# Patient Record
Sex: Female | Born: 1975 | Race: White | Hispanic: No | Marital: Married | Smoking: Former smoker
Health system: Southern US, Community
[De-identification: ages and names within clinical notes are randomized; demographics above are authoritative.]

## PROBLEM LIST (undated history)

## (undated) DIAGNOSIS — J45909 Unspecified asthma, uncomplicated: Secondary | ICD-10-CM

## (undated) DIAGNOSIS — N840 Polyp of corpus uteri: Secondary | ICD-10-CM

## (undated) DIAGNOSIS — K219 Gastro-esophageal reflux disease without esophagitis: Secondary | ICD-10-CM

## (undated) HISTORY — DX: Unspecified asthma, uncomplicated: J45.909

---
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
## 2002-08-26 HISTORY — PX: WISDOM TOOTH EXTRACTION: SHX21

## 2016-07-05 ENCOUNTER — Encounter: Payer: Self-pay | Admitting: Emergency Medicine

## 2016-07-05 ENCOUNTER — Ambulatory Visit (INDEPENDENT_AMBULATORY_CARE_PROVIDER_SITE_OTHER): Payer: Worker's Compensation

## 2016-07-05 ENCOUNTER — Ambulatory Visit
Admission: EM | Admit: 2016-07-05 | Discharge: 2016-07-05 | Disposition: A | Discharge status: Home / Self Care | Payer: Worker's Compensation | Attending: Family Medicine | Admitting: Family Medicine

## 2016-07-05 DIAGNOSIS — M940 Chondrocostal junction syndrome [Tietze]: Secondary | ICD-10-CM

## 2016-07-05 MED ORDER — NAPROXEN 500 MG PO TABS
500.0000 mg | ORAL_TABLET | Freq: Two times a day (BID) | ORAL | 0 refills | Status: DC
Start: 2016-07-05 — End: 2019-01-25

## 2016-07-05 NOTE — ED Provider Notes (Signed)
CSN: [id number]     Arrival date & time 07/05/16  [id number] History   First MD Initiated Contact with Patient 07/05/16 1005     Chief Complaint  Patient presents with  . right sided rib pain   (Consider location/radiation/quality/duration/timing/severity/associated sxs/prior Treatment) HPI  This a 40 year old female since with 17 day history of right lower rib pain anteriorly. 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 States that has been bothersome since then and since it has persisted and wanted to have it "checked out". His had no cough or shortness of breath denies any discomfort with breathing. Movement however is painful particularly with thoracic rotation or bending to the right. Is point tender anterior in the midclavicular line. Denies any fever or chills. He denies any back pain. Most comfortable position is with recumbency.        History reviewed. No pertinent past medical history. History reviewed. No pertinent surgical history. Family History  Problem Relation Age of Onset  . Rheum arthritis Mother   . CAD Mother   . Cancer Father    Social History  Substance Use Topics  . Smoking status: Former Research scientist (life sciences)  . Smokeless tobacco: Never Used  . Alcohol use Yes   OB History    No data available     Review of Systems  Constitutional: Positive for activity change. Negative for chills, fatigue and fever.  Musculoskeletal: Positive for myalgias.  All other systems reviewed and are negative.   Allergies  Patient has no known allergies.  Home Medications   Prior to Admission medications   Medication Sig Start Date End Date Taking? Authorizing Provider  loratadine (CLARITIN) 10 MG tablet Take 10 mg by mouth daily.   Yes Historical Provider, MD  naproxen (NAPROSYN) 500 MG tablet Take 1 tablet (500 mg total) by mouth 2 (two) times daily with a meal. 07/05/16   Lorin Picket, PA-C   Meds Ordered and Administered this Visit  Medications - No data to display  BP 103/84 (BP Location: Left Arm)   Pulse 86   Temp 97.4 F (36.3 C) (Tympanic)   Resp 16   Ht 5\' 3"  (1.6 m)   Wt 160 lb (72.6 kg)   LMP 06/21/2016 (Within Days) Comment: denies preg  SpO2 100%   BMI 28.34 kg/m  No data found.   Physical Exam  Constitutional: She is oriented to person, place, and time. She appears well-developed and well-nourished. No distress.  HENT:  Head: Normocephalic and atraumatic.  Eyes: EOM are normal. Pupils are equal, round, and reactive to light.  Neck: Normal range of motion. Neck supple.  Pulmonary/Chest: Effort normal and breath sounds normal. No respiratory distress. She has no wheezes. She has no rales. She exhibits tenderness.  Examination of the chest wall shows a tenderness sharply localized over the inferior most rib on the right in the midclavicular line. This reproduces Her symptoms. Is no crepitus or induration. Thoracic rotation is uncomfortable particularly with left thoracic rotation. Lateral flexion tends to increase the pain on the right. No other rib pain along the lateral aspect into the thoracic parathoracic area.  Musculoskeletal: Normal range of motion. She exhibits tenderness.  Neurological: She is alert and oriented to person, place, and time.  Skin: Skin is warm and dry. She is not diaphoretic.  Psychiatric: She has a normal mood and affect. Her  behavior is normal. Judgment and thought content normal.  Nursing note and vitals reviewed.   Urgent Care Course   Clinical Course     Procedures (including critical care time)  Labs Review Labs Reviewed - No data to display  Imaging Review Dg Chest 2 View  Result Date: 07/05/2016 CLINICAL DATA:  Chest pain after heavy lifting EXAM: CHEST  2 VIEW COMPARISON:  None. FINDINGS: Lungs are clear. Heart size and pulmonary vascularity are normal. No adenopathy. No pneumothorax. No bone  lesions. IMPRESSION: No edema or consolidation. Electronically Signed   By: Lowella Grip III M.D.   On: 07/05/2016 10:29     Visual Acuity Review  Right Eye Distance:   Left Eye Distance:   Bilateral Distance:    Right Eye Near:   Left Eye Near:    Bilateral Near:         MDM   1. Costochondritis, acute    New Prescriptions   NAPROXEN (NAPROSYN) 500 MG TABLET    Take 1 tablet (500 mg total) by mouth 2 (two) times daily with a meal.  Plan: 1. Test/x-ray results and diagnosis reviewed with patient 2. rx as per orders; risks, benefits, potential side effects reviewed with patient 3. Recommend supportive treatment with Symptom avoidance and rest. Use Naprosyn for pain control follow-up if not improving in 2 weeks 4. F/u prn if symptoms worsen or don't improve     Lorin Picket, PA-C 07/05/16 [address] [place], [place] 07/05/16 1103

## 2016-07-05 NOTE — ED Triage Notes (Signed)
Patient states that she might have pulled a muscle while at work last Tuesday.  Patient c/o pain on the right side of her ribcage.

## 2018-07-08 ENCOUNTER — Other Ambulatory Visit: Payer: Self-pay | Admitting: Physician Assistant

## 2018-08-18 ENCOUNTER — Encounter (HOSPITAL_BASED_OUTPATIENT_CLINIC_OR_DEPARTMENT_OTHER): Payer: Self-pay

## 2018-09-02 ENCOUNTER — Ambulatory Visit (HOSPITAL_BASED_OUTPATIENT_CLINIC_OR_DEPARTMENT_OTHER): Payer: BC Managed Care – PPO | Admitting: Hematology & Oncology

## 2018-09-02 ENCOUNTER — Encounter (HOSPITAL_BASED_OUTPATIENT_CLINIC_OR_DEPARTMENT_OTHER): Payer: Self-pay | Admitting: Hematology & Oncology

## 2018-09-02 VITALS — BP 111/76 | HR 88 | Temp 98.0°F | Resp 17 | Ht 64.0 in | Wt 169.0 lb

## 2018-09-02 DIAGNOSIS — R161 Splenomegaly, not elsewhere classified: Secondary | ICD-10-CM

## 2018-09-02 LAB — COMPREHENSIVE METABOLIC PANEL
ALT: 10 U/L (ref 0–55)
AST (SGOT): 13 U/L (ref 5–34)
Albumin/Globulin Ratio: 1.4 (ref 0.9–2.2)
Albumin: 3.8 g/dL (ref 3.5–5.0)
Alkaline Phosphatase: 76 U/L (ref 37–106)
BUN: 7 mg/dL (ref 7.0–19.0)
Bilirubin, Total: 0.8 mg/dL (ref 0.2–1.2)
CO2: 26 mEq/L (ref 21–29)
Calcium: 9.5 mg/dL (ref 8.5–10.5)
Chloride: 104 mEq/L (ref 100–111)
Creatinine: 0.7 mg/dL (ref 0.4–1.5)
Globulin: 2.8 g/dL (ref 2.0–3.7)
Glucose: 88 mg/dL (ref 70–100)
Potassium: 3.8 mEq/L (ref 3.5–5.1)
Protein, Total: 6.6 g/dL (ref 6.0–8.3)
Sodium: 137 mEq/L (ref 136–145)

## 2018-09-02 LAB — CBC AND DIFFERENTIAL
Absolute NRBC: 0 10*3/uL (ref 0.00–0.00)
Basophils Absolute Automated: 0.03 10*3/uL (ref 0.00–0.08)
Basophils Automated: 0.4 %
Eosinophils Absolute Automated: 0.4 10*3/uL (ref 0.00–0.44)
Eosinophils Automated: 5.8 %
Hematocrit: 38.3 % (ref 34.7–43.7)
Hgb: 12.8 g/dL (ref 11.4–14.8)
Immature Granulocytes Absolute: 0.01 10*3/uL (ref 0.00–0.07)
Immature Granulocytes: 0.1 %
Lymphocytes Absolute Automated: 1.89 10*3/uL (ref 0.42–3.22)
Lymphocytes Automated: 27.2 %
MCH: 29.7 pg (ref 25.1–33.5)
MCHC: 33.4 g/dL (ref 31.5–35.8)
MCV: 88.9 fL (ref 78.0–96.0)
MPV: 10.8 fL (ref 8.9–12.5)
Monocytes Absolute Automated: 0.47 10*3/uL (ref 0.21–0.85)
Monocytes: 6.8 %
Neutrophils Absolute: 4.15 10*3/uL (ref 1.10–6.33)
Neutrophils: 59.7 %
Nucleated RBC: 0 /100 WBC (ref 0.0–0.0)
Platelets: 218 10*3/uL (ref 142–346)
RBC: 4.31 10*6/uL (ref 3.90–5.10)
RDW: 13 % (ref 11–15)
WBC: 6.95 10*3/uL (ref 3.10–9.50)

## 2018-09-02 LAB — LACTATE DEHYDROGENASE: LDH: 129 U/L (ref 125–331)

## 2018-09-02 LAB — IGG, IGA, IGM
Immunoglobulin A: 194 mg/dL (ref 65–421)
Immunoglobulin G: 1135 mg/dL (ref 540–1822)
Immunoglobulin M: 59 mg/dL (ref 22–293)

## 2018-09-02 LAB — GFR: EGFR: >60

## 2018-09-02 LAB — HEMOLYSIS INDEX: Hemolysis Index: 5 (ref 0–18)

## 2018-09-02 NOTE — Progress Notes (Signed)
CONSULTATION    Date Time: 09/02/2018 1:38 PM  Patient Name: [name]  Requesting Physician: No att. providers found      Reason for Consultation:   Splenomegaly    History:   Beth Dillon is a 43 y.o. female who was recently evaluated for her abdominal pain and in the process had an ultrasound which indicated enlarged spleen (14.8 cm). Abdominal Pain has resolved after starting on Protonix. Her weight has been stable. She denies fevers, drenching night sweats or any pain at this time. 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    Past Medical History:   No past medical history on file.    Past Surgical History:   No past surgical history on file.    Family History:   No family history on file.    Social History:     Social History     Socioeconomic History    Marital status: Unknown     Spouse name: Not on file    Number of children: Not on file    Years of education: Not on file    Highest education level: Not on file   Occupational History    Not on file   Social Needs    Financial resource strain: Not on file    Food insecurity:     Worry: Not on file     Inability: Not on file    Transportation needs:     Medical: Not on file     Non-medical: Not on file   Tobacco Use    Smoking status: Not on file   Substance and Sexual Activity    Alcohol use: Not on file    Drug use: Not on file    Sexual activity: Not on file   Lifestyle    Physical activity:     Days per week: Not on file     Minutes per session: Not on file    Stress: Not on file   Relationships    Social connections:     Talks on phone: Not on file     Gets together: Not on file     Attends religious service: Not on file     Active member of club or organization: Not on file     Attends meetings of clubs or organizations: Not on file     Relationship status: Not on file    Intimate partner violence:     Fear of current or ex partner: Not on file     Emotionally abused: Not on file     Physically abused: Not on file      Forced sexual activity: Not on file   Other Topics Concern    Not on file   Social History Narrative    Not on file       Allergies:   Allergies not on file    Medications:     No current outpatient medications on file.     No current facility-administered medications for this visit.        Review of Systems:   A comprehensive review of systems was: General ROS: negative for - chills, fatigue, fever, malaise, night sweats or weight loss  Hematological and Lymphatic ROS: negative for - bleeding problems or blood clots  Respiratory ROS: no cough, shortness of breath, or wheezing  Cardiovascular ROS: no chest pain or dyspnea on exertion  Gastrointestinal ROS: no abdominal pain, change in bowel habits, or black or bloody  stools  Genito-Urinary ROS: no dysuria, trouble voiding, or hematuria  Musculoskeletal ROS: negative for - gait disturbance or joint stiffness  Neurological ROS: negative for - confusion, dizziness or weakness    Physical Exam:   There were no vitals filed for this visit.        General appearance - alert, well appearing, and in no distress  Mental status - alert, oriented to person, place, and time  Mouth - mucous membranes moist, pharynx normal without lesions  Neck - supple, no significant adenopathy  Lymphatics - no palpable lymphadenopathy, no hepatosplenomegaly  Chest - clear to auscultation, no wheezes, rales or rhonchi, symmetric air entry  Heart - normal rate, regular rhythm, normal S1, S2, no murmurs, rubs, clicks or gallops  Abdomen - soft, nontender, nondistended, no masses or organomegaly  Extremities - peripheral pulses normal, no pedal edema, no clubbing or cyanosis    Labs Reviewed:     Results     ** No results found for the last 24 hours. **              Rads:   Radiological Procedure reviewed.     Assessment:   Very pleasant 43 year old lady with Splenomegaly.   CBC normal range  Plan:   Discussed causes of splenomegaly. Some times can be seen as a normal variant  Liver disease   Infiltrative Disorders  Hematological malignancies  Extramedullary hematopoieses   Upper limit of normal for female is 12.5 cm  Will order comprehensive work up to evaluate for hematological malignancy  Flow cytometry on peripheral blood, Leukemia/Lymphoma panel  LDH, Serum protein electrophoresis  Pt will need ct scan chest abdomen and pelvis for evaluation of Lymphnodes    Signed by: Acquanetta Sit

## 2018-09-03 ENCOUNTER — Ambulatory Visit: Payer: BC Managed Care – PPO

## 2018-09-03 ENCOUNTER — Telehealth (HOSPITAL_BASED_OUTPATIENT_CLINIC_OR_DEPARTMENT_OTHER): Payer: Self-pay | Admitting: Hematology & Oncology

## 2018-09-03 LAB — IMMUNOFIXATION ELECTROPHORESIS

## 2018-09-03 LAB — SERUM PROTEIN ELECTROPHORESIS REVIEW

## 2018-09-03 LAB — PROTEIN ELECTROPHORESIS, SERUM
Albumin %: 50.9 % (ref 46.6–62.6)
Albumin, Synovial: 3.4 g/dL (ref 3.4–4.8)
Alpha-1 Glob %: 2.8 % (ref 1.7–4.1)
Alpha-1 Globulin: 0.2 g/dL (ref 0.1–0.4)
Alpha-2 Glob %: 13 % (ref 8.9–14.9)
Alpha-2 Globulin: 0.9 g/dL (ref 0.8–1.2)
Beta Glob %: 15.2 % (ref 10.9–18.9)
Beta Globulin: 1 g/dL (ref 0.6–1.2)
Gamma Globulin %: 18 % (ref 9.8–24.4)
Gamma Globulin: 1.2 g/dL (ref 0.6–1.7)
Protein, Total: 6.6 g/dL (ref 6.0–8.3)

## 2018-09-03 LAB — IFE REVIEW, SERUM

## 2018-09-03 NOTE — Telephone Encounter (Signed)
Nima from [facility] CT dept.called to speak to nurse regarding patient's order.   Call back at [phone]

## 2018-09-03 NOTE — Addendum Note (Signed)
Addended by: Elana Alm on: 09/03/2018 11:26 AM     Modules accepted: Orders

## 2018-09-04 ENCOUNTER — Other Ambulatory Visit (HOSPITAL_BASED_OUTPATIENT_CLINIC_OR_DEPARTMENT_OTHER): Payer: Self-pay | Admitting: Hematology & Oncology

## 2018-09-04 ENCOUNTER — Ambulatory Visit
Admission: RE | Admit: 2018-09-04 | Discharge: 2018-09-04 | Disposition: A | Discharge status: Home / Self Care | Payer: BC Managed Care – PPO | Source: Ambulatory Visit | Attending: Hematology & Oncology | Admitting: Hematology & Oncology

## 2018-09-04 ENCOUNTER — Telehealth (HOSPITAL_BASED_OUTPATIENT_CLINIC_OR_DEPARTMENT_OTHER): Payer: Self-pay | Admitting: Hematology & Oncology

## 2018-09-04 DIAGNOSIS — Z862 Personal history of diseases of the blood and blood-forming organs and certain disorders involving the immune mechanism: Secondary | ICD-10-CM | POA: Insufficient documentation

## 2018-09-04 DIAGNOSIS — C801 Malignant (primary) neoplasm, unspecified: Secondary | ICD-10-CM

## 2018-09-04 DIAGNOSIS — R161 Splenomegaly, not elsewhere classified: Secondary | ICD-10-CM

## 2018-09-04 LAB — BETA-2 MICROGLOBULIN: Beta-2-Microglobulin: 1.56 (ref 1.21–2.70)

## 2018-09-04 MED ORDER — IOHEXOL 350 MG/ML IV SOLN
100.00 mL | Freq: Once | INTRAVENOUS | Status: AC | PRN
Start: 2018-09-04 — End: 2018-09-04
  Administered 2018-09-04: 15:00:00 100 mL via INTRAVENOUS

## 2018-09-04 NOTE — Telephone Encounter (Signed)
New order for CT of chest refaxed to [facility] to r/o hematological malignancy related to enlarged spleen

## 2018-09-04 NOTE — Telephone Encounter (Signed)
Patient arrived in office with orders in hand. She was able to get the CT of the abdomen, but the CT of the chest was denied by the insurance.  Per Dr. Milta Deiters note, he wanted to CT scans to rule out hematology malignancy due to enlarged spleen.  New CT of chest order in place for hematology malignancy with new ICD-10 code.  Order faxed to [facility]- left voice message for Delphine that new order for chest CT being faxed today so pt can reschedule the Chest CT.  Patient is aware.

## 2018-09-04 NOTE — Telephone Encounter (Signed)
Delphine from [employer] called to speak to nurse. Patient's CT Chest is not approved by insurance, denied because does not meet criteria.   Checking if Dr.Khan would like to do peer to peer review or cancel CT.     Call back at [phone]

## 2018-09-05 LAB — FREE KAPPA & LAMBDA LIGHT CHAINS PLUS RATIO, QUANTITATIVE, SERUM
Free Kappa, Serum: 13.2 mg/L (ref 3.3–19.4)
Free Kappa/Lambda Ratio: 0.73 (ref 0.26–1.65)
Free Lambda, Serum: 18 mg/L (ref 5.7–26.3)

## 2018-09-07 ENCOUNTER — Other Ambulatory Visit: Payer: Self-pay | Admitting: Hematology & Oncology

## 2018-09-07 ENCOUNTER — Ambulatory Visit: Payer: BC Managed Care – PPO

## 2018-09-07 ENCOUNTER — Telehealth (HOSPITAL_BASED_OUTPATIENT_CLINIC_OR_DEPARTMENT_OTHER): Payer: Self-pay | Admitting: Hematology & Oncology

## 2018-09-07 DIAGNOSIS — C801 Malignant (primary) neoplasm, unspecified: Secondary | ICD-10-CM

## 2018-09-07 NOTE — Telephone Encounter (Signed)
Patient called to check to see if new CT order was placed.  Request for order to be mailed to her home address.  Patient made aware that order was placed and order will be mailed per her request.

## 2018-09-07 NOTE — Telephone Encounter (Signed)
Patient called in the office and wants to fax over the CT of Chest order to [facility] radiation facility. Faxed over CT scan order at [phone]. Pt was notified to get it done at same facility in order to do the comparison but she refused and wants to get it done at [facility].

## 2018-09-14 ENCOUNTER — Other Ambulatory Visit: Payer: Self-pay

## 2018-09-16 ENCOUNTER — Telehealth (HOSPITAL_BASED_OUTPATIENT_CLINIC_OR_DEPARTMENT_OTHER): Payer: Self-pay | Admitting: Hematology & Oncology

## 2018-09-16 NOTE — Telephone Encounter (Signed)
Dr. Welton Flakes made aware and would like patient to have scan done.  Patient made aware and reported that radiology department will be faxing over information about why the scan was denied by insurance.

## 2018-09-16 NOTE — Telephone Encounter (Signed)
Patient called to report that her CT of chest was denied by her insurance and that the radiology department will fax the denial.  Fax number provided to patient.

## 2018-09-17 ENCOUNTER — Encounter (HOSPITAL_BASED_OUTPATIENT_CLINIC_OR_DEPARTMENT_OTHER): Payer: Self-pay | Admitting: Hematology & Oncology

## 2018-09-18 NOTE — Telephone Encounter (Signed)
Voicemail left for patient notifying her that we have not received any information about denial of chest CT.  Requesting information about radiology department she is using.

## 2018-09-21 LAB — LEUKEMIA/LYMPHOMA EVALUATION PANEL
Number of Markers:: 22
Viability: 97 %

## 2018-09-21 NOTE — Telephone Encounter (Signed)
Called patients insurance [employer] to further assist patient for denial of CT of chest because the office never received a denial from the the radiology department in [place].  On hold over one hour with no success of speaking to anyone.

## 2018-09-21 NOTE — Telephone Encounter (Signed)
Dr. Welton Flakes made aware that CT scan of chest was cancelled and was denied per radiology clinic in [facility].  Per Dr. Welton Flakes, patient needs to f/u in 3-6 months.  Voicemail left for patient to call office to schedule f/u.

## 2018-12-21 ENCOUNTER — Encounter (HOSPITAL_BASED_OUTPATIENT_CLINIC_OR_DEPARTMENT_OTHER): Admission: RE | Payer: Self-pay | Source: Home / Self Care

## 2018-12-21 ENCOUNTER — Ambulatory Visit (HOSPITAL_BASED_OUTPATIENT_CLINIC_OR_DEPARTMENT_OTHER)
Admission: RE | Admit: 2018-12-21 | Payer: BLUE CROSS/BLUE SHIELD | Source: Home / Self Care | Admitting: Obstetrics and Gynecology

## 2018-12-21 SURGERY — HYSTERECTOMY, VAGINAL, LAPAROSCOPY-ASSISTED, WITH SALPINGECTOMY
Anesthesia: General

## 2018-12-30 ENCOUNTER — Telehealth (HOSPITAL_BASED_OUTPATIENT_CLINIC_OR_DEPARTMENT_OTHER): Payer: Self-pay | Admitting: Hematology & Oncology

## 2018-12-30 NOTE — Telephone Encounter (Signed)
Patient called to get a CT order  Okay to enter in Epic, she will go to [facility] facility.   Confirm w/patient when order is placed.   Phone# [phone]

## 2018-12-30 NOTE — Telephone Encounter (Signed)
Patient made aware CT of chest is in epic and she can proceed with scheduling.  Patient wanted to know if she should have another CT scan of abdomen and pelvis.  Patient made aware that I will speak with Dr. Welton Flakes and call her back.  Stated an understanding.

## 2018-12-30 NOTE — Telephone Encounter (Signed)
Dr. Welton Flakes would like patient to proceed with CT of chest only if insurance approves.  Patient made aware and will schedule.  Patient made aware that she needs to f/u after scan.  Stated an understanding.

## 2019-01-27 NOTE — Patient Instructions (Addendum)
Your procedure is scheduled on  Thursday 02/04/2019   Report to [facility]. M.   Call this number if you have problems the morning of surgery  :[phone].   OUR ADDRESS IS [place].  WE ARE LOCATED IN [facility].                                     REMEMBER:  DO NOT EAT FOOD OR DRINK LIQUIDS AFTER MIDNIGHT .    TAKE THESE MEDICATIONS MORNING OF SURGERY WITH A SIP OF WATER:  Pantoprazole (Protonix), Loratadine (Claritin)   IF YOU ARE SPENDING THE NIGHT AFTER SURGERY PLEASE BRING ALL YOUR PRESCRIPTION MEDICATIONS IN THEIR ORIGINAL BOTTLES.                                    DO NOT WEAR JEWERLY, MAKE UP, OR NAIL POLISH,  DO NOT WEAR LOTIONS, POWDERS, PERFUMES OR DEODORANT. DO NOT SHAVE FOR 24 HOURS PRIOR TO DAY OF SURGERY.  CONTACTS, GLASSES, OR DENTURES MAY NOT BE WORN TO SURGERY.                                    [facility] IS NOT RESPONSIBLE  FOR ANY BELONGINGS.                                                                    Marland Kitchen                                                                                                    [facility] - Preparing for Surgery Before surgery, you can play an important role.  Because skin is not sterile, your skin needs to be as free of germs as possible.  You can reduce the number of germs on your skin by washing with CHG (chlorahexidine gluconate) soap before surgery.  CHG is an antiseptic cleaner which kills germs and bonds with the skin to continue killing germs even after washing. Please DO NOT use if you have an allergy to CHG or antibacterial soaps.  If your skin becomes reddened/irritated stop using the CHG and inform your nurse when you arrive at Short Stay. Do not shave (including legs and underarms) for at least 48 hours prior to the first CHG shower.  You may shave your face/neck. Please follow these instructions carefully:  1.  Shower with CHG Soap the night before surgery  and the  morning of Surgery.  2.  If you choose to wash your hair, wash your hair first as usual with  your  normal  shampoo.  3.  After you shampoo, rinse your hair and body thoroughly to remove the  shampoo.                                        4 .  Use CHG as you would any other liquid soap.  You can apply chg directly  to the skin and wash                       Gently with a scrungie or clean washcloth.  5.  Apply the CHG Soap to your body ONLY FROM THE NECK DOWN.   Do not use on face/ open                           Wound or open sores. Avoid contact with eyes, ears mouth and genitals (private parts).                       Wash face,  Genitals (private parts) with your normal soap.             6.  Wash thoroughly, paying special attention to the area where your surgery  will be performed.  7.  Thoroughly rinse your body with warm water from the neck down.  8.  DO NOT shower/wash with your normal soap after using and rinsing off  the CHG Soap.                9.  Pat yourself dry with a clean towel.            10.  Wear clean pajamas.            11.  Place clean sheets on your bed the night of your first shower and do not  sleep with pets. Day of Surgery : Do not apply any lotions/deodorants the morning of surgery.  Please wear clean clothes to the hospital/surgery center.  FAILURE TO FOLLOW THESE INSTRUCTIONS MAY RESULT IN THE CANCELLATION OF YOUR SURGERY PATIENT SIGNATURE_________________________________  NURSE SIGNATURE__________________________________  ________________________________________________________________________   Adam Phenix  An incentive spirometer is a tool that can help keep your lungs clear and active. This tool measures how well you are filling your lungs with each breath. Taking long deep breaths may help reverse or decrease the chance of developing breathing (pulmonary) problems (especially infection) following:  A long period of time when you are  unable to move or be active. BEFORE THE PROCEDURE   If the spirometer includes an indicator to show your best effort, your nurse or respiratory therapist will set it to a desired goal.  If possible, sit up straight or lean slightly forward. Try not to slouch.  Hold the incentive spirometer in an upright position. INSTRUCTIONS FOR USE  1. Sit on the edge of your bed if possible, or sit up as far as you can in bed or on a chair. 2. Hold the incentive spirometer in an upright position. 3. Breathe out normally. 4. Place the mouthpiece in your mouth and seal your lips tightly around it. 5. Breathe in slowly and as deeply as possible, raising the piston or the ball toward the top of the column. 6. Hold your breath for 3-5 seconds or for as long as possible. Allow the piston or ball to  fall to the bottom of the column. 7. Remove the mouthpiece from your mouth and breathe out normally. 8. Rest for a few seconds and repeat Steps 1 through 7 at least 10 times every 1-2 hours when you are awake. Take your time and take a few normal breaths between deep breaths. 9. The spirometer may include an indicator to show your best effort. Use the indicator as a goal to work toward during each repetition. 10. After each set of 10 deep breaths, practice coughing to be sure your lungs are clear. If you have an incision (the cut made at the time of surgery), support your incision when coughing by placing a pillow or rolled up towels firmly against it. Once you are able to get out of bed, walk around indoors and cough well. You may stop using the incentive spirometer when instructed by your caregiver.  RISKS AND COMPLICATIONS  Take your time so you do not get dizzy or light-headed.  If you are in pain, you may need to take or ask for pain medication before doing incentive spirometry. It is harder to take a deep breath if you are having pain. AFTER USE  Rest and breathe slowly and easily.  It can be helpful to  keep track of a log of your progress. Your caregiver can provide you with a simple table to help with this. If you are using the spirometer at home, follow these instructions: [place] IF:   You are having difficultly using the spirometer.  You have trouble using the spirometer as often as instructed.  Your pain medication is not giving enough relief while using the spirometer.  You develop fever of 100.5 F (38.1 C) or higher. SEEK IMMEDIATE MEDICAL CARE IF:   You cough up bloody sputum that had not been present before.  You develop fever of 102 F (38.9 C) or greater.  You develop worsening pain at or near the incision site. MAKE SURE YOU:   Understand these instructions.  Will watch your condition.  Will get help right away if you are not doing well or get worse. Document Released: 12/23/2006 Document Revised: 11/04/2011 Document Reviewed: 02/23/2007 ExitCare Patient Information 2014 ExitCare, [place].   ________________________________________________________________________  WHAT IS A BLOOD TRANSFUSION? Blood Transfusion Information  A transfusion is the replacement of blood or some of its parts. Blood is made up of multiple cells which provide different functions.  Red blood cells carry oxygen and are used for blood loss replacement.  White blood cells fight against infection.  Platelets control bleeding.  Plasma helps clot blood.  Other blood products are available for specialized needs, such as hemophilia or other clotting disorders. BEFORE THE TRANSFUSION  Who gives blood for transfusions?   Healthy volunteers who are fully evaluated to make sure their blood is safe. This is blood bank blood. Transfusion therapy is the safest it has ever been in the practice of medicine. Before blood is taken from a donor, a complete history is taken to make sure that person has no history of diseases nor engages in risky social behavior (examples are intravenous drug  use or sexual activity with multiple partners). The donor's travel history is screened to minimize risk of transmitting infections, such as malaria. The donated blood is tested for signs of infectious diseases, such as HIV and hepatitis. The blood is then tested to be sure it is compatible with you in order to minimize the chance of a transfusion reaction. If you or a relative  donates blood, this is often done in anticipation of surgery and is not appropriate for emergency situations. It takes many days to process the donated blood. RISKS AND COMPLICATIONS Although transfusion therapy is very safe and saves many lives, the main dangers of transfusion include:   Getting an infectious disease.  Developing a transfusion reaction. This is an allergic reaction to something in the blood you were given. Every precaution is taken to prevent this. The decision to have a blood transfusion has been considered carefully by your caregiver before blood is given. Blood is not given unless the benefits outweigh the risks. AFTER THE TRANSFUSION  Right after receiving a blood transfusion, you will usually feel much better and more energetic. This is especially true if your red blood cells have gotten low (anemic). The transfusion raises the level of the red blood cells which carry oxygen, and this usually causes an energy increase.  The nurse administering the transfusion will monitor you carefully for complications. HOME CARE INSTRUCTIONS  No special instructions are needed after a transfusion. You may find your energy is better. Speak with your caregiver about any limitations on activity for underlying diseases you may have. SEEK MEDICAL CARE IF:   Your condition is not improving after your transfusion.  You develop redness or irritation at the intravenous (IV) site. SEEK IMMEDIATE MEDICAL CARE IF:  Any of the following symptoms occur over the next 12 hours:  Shaking chills.  You have a temperature by mouth  above 102 F (38.9 C), not controlled by medicine.  Chest, back, or muscle pain.  People around you feel you are not acting correctly or are confused.  Shortness of breath or difficulty breathing.  Dizziness and fainting.  You get a rash or develop hives.  You have a decrease in urine output.  Your urine turns a dark color or changes to pink, red, or brown. Any of the following symptoms occur over the next 10 days:  You have a temperature by mouth above 102 F (38.9 C), not controlled by medicine.  Shortness of breath.  Weakness after normal activity.  The white part of the eye turns yellow (jaundice).  You have a decrease in the amount of urine or are urinating less often.  Your urine turns a dark color or changes to pink, red, or brown. Document Released: 08/09/2000 Document Revised: 11/04/2011 Document Reviewed: 03/28/2008 [facility] Patient Information 2014 [place], [place].  _______________________________________________________________________

## 2019-01-28 ENCOUNTER — Encounter (HOSPITAL_COMMUNITY): Payer: Self-pay

## 2019-01-28 ENCOUNTER — Other Ambulatory Visit: Payer: Self-pay

## 2019-01-28 ENCOUNTER — Encounter (HOSPITAL_COMMUNITY)
Admission: RE | Admit: 2019-01-28 | Discharge: 2019-01-28 | Disposition: A | Discharge status: Home / Self Care | Payer: BC Managed Care – PPO | Source: Ambulatory Visit | Attending: Obstetrics and Gynecology | Admitting: Obstetrics and Gynecology

## 2019-01-28 DIAGNOSIS — Z01812 Encounter for preprocedural laboratory examination: Secondary | ICD-10-CM | POA: Diagnosis present

## 2019-01-28 HISTORY — DX: Gastro-esophageal reflux disease without esophagitis: K21.9

## 2019-01-28 LAB — CBC
HCT: 42.2 % (ref 36.0–46.0)
Hemoglobin: 13.5 g/dL (ref 12.0–15.0)
MCH: 28.8 pg (ref 26.0–34.0)
MCHC: 32 g/dL (ref 30.0–36.0)
MCV: 90 fL (ref 80.0–100.0)
Platelets: 236 10*3/uL (ref 150–400)
RBC: 4.69 MIL/uL (ref 3.87–5.11)
RDW: 14.2 % (ref 11.5–15.5)
WBC: 5.1 10*3/uL (ref 4.0–10.5)
nRBC: 0 % (ref 0.0–0.2)

## 2019-01-28 LAB — COMPREHENSIVE METABOLIC PANEL
ALT: 12 U/L (ref 0–44)
AST: 18 U/L (ref 15–41)
Albumin: 4.1 g/dL (ref 3.5–5.0)
Alkaline Phosphatase: 87 U/L (ref 38–126)
Anion gap: 8 (ref 5–15)
BUN: 15 mg/dL (ref 6–20)
CO2: 26 mmol/L (ref 22–32)
Calcium: 9.2 mg/dL (ref 8.9–10.3)
Chloride: 104 mmol/L (ref 98–111)
Creatinine, Ser: 0.73 mg/dL (ref 0.44–1.00)
GFR calc Af Amer: >60 mL/min (ref >60)
GFR calc non Af Amer: >60 mL/min (ref >60)
Glucose, Bld: 104 mg/dL — ABNORMAL HIGH (ref 70–99)
Potassium: 4.2 mmol/L (ref 3.5–5.1)
Sodium: 138 mmol/L (ref 135–145)
Total Bilirubin: 0.9 mg/dL (ref 0.3–1.2)
Total Protein: 7.8 g/dL (ref 6.5–8.1)

## 2019-01-28 LAB — ABO/RH: ABO/RH(D): A NEG

## 2019-02-01 ENCOUNTER — Other Ambulatory Visit: Payer: Self-pay

## 2019-02-01 ENCOUNTER — Other Ambulatory Visit (HOSPITAL_COMMUNITY)
Admission: RE | Admit: 2019-02-01 | Discharge: 2019-02-01 | Disposition: A | Discharge status: Home / Self Care | Payer: BC Managed Care – PPO | Source: Ambulatory Visit | Attending: Obstetrics and Gynecology | Admitting: Obstetrics and Gynecology

## 2019-02-01 DIAGNOSIS — Z1159 Encounter for screening for other viral diseases: Secondary | ICD-10-CM | POA: Diagnosis present

## 2019-02-02 LAB — NOVEL CORONAVIRUS, NAA (HOSP ORDER, SEND-OUT TO REF LAB; TAT 18-24 HRS): SARS-CoV-2, NAA: NOT DETECTED

## 2019-02-03 ENCOUNTER — Encounter (HOSPITAL_BASED_OUTPATIENT_CLINIC_OR_DEPARTMENT_OTHER): Payer: Self-pay | Admitting: Obstetrics and Gynecology

## 2019-02-03 DIAGNOSIS — N840 Polyp of corpus uteri: Secondary | ICD-10-CM

## 2019-02-03 DIAGNOSIS — D251 Intramural leiomyoma of uterus: Secondary | ICD-10-CM

## 2019-02-03 HISTORY — DX: Intramural leiomyoma of uterus: D25.1

## 2019-02-03 HISTORY — DX: Polyp of corpus uteri: N84.0

## 2019-02-03 NOTE — Progress Notes (Signed)
SPOKE W/  _ [place] 19:   COUGH-- no  RUNNY NOSE--- no  SORE THROAT--- no  NASAL CONGESTION---- no  SNEEZING---- no  SHORTNESS OF BREATH--- no  DIFFICULTY BREATHING--- no  TEMP >100.0 ----- no  UNEXPLAINED BODY ACHES------ no  CHILLS -------- no  HEADACHES --------- no  LOSS OF SMELL/ TASTE -------- no    HAVE YOU OR ANY FAMILY MEMBER TRAVELLED PAST 14 DAYS OUT OF THE   COUNTY--- no STATE---- no COUNTRY---- no  HAVE YOU OR ANY FAMILY MEMBER BEEN EXPOSED TO ANYONE WITH COVID 19? no

## 2019-02-03 NOTE — Anesthesia Preprocedure Evaluation (Addendum)
Anesthesia Evaluation  Patient identified by MRN, date of birth, ID band Patient awake    Reviewed: Allergy & Precautions, H&P , Patient's Chart, lab work & pertinent test results  Airway Mallampati: II  TM Distance: >3 FB Neck ROM: Full    Dental no notable dental hx. (+) Teeth Intact, Dental Advisory Given,    Pulmonary neg pulmonary ROS, former smoker,    Pulmonary exam normal breath sounds clear to auscultation       Cardiovascular Exercise Tolerance: Good negative cardio ROS Normal cardiovascular exam Rhythm:Regular Rate:Normal     Neuro/Psych negative neurological ROS  negative psych ROS   GI/Hepatic Neg liver ROS, GERD  Medicated and Controlled,  Endo/Other  negative endocrine ROS  Renal/GU negative Renal ROS  negative genitourinary   Musculoskeletal   Abdominal   Peds  Hematology negative hematology ROS (+)   Anesthesia Other Findings + Motion sickness  Reproductive/Obstetrics negative OB ROS Uterine fibroid and polyp                         Anesthesia Physical Anesthesia Plan  ASA: II  Anesthesia Plan: General   Post-op Pain Management:    Induction: Intravenous  PONV Risk Score and Plan: 3 and Ondansetron, Treatment may vary due to age or medical condition, Dexamethasone and Midazolam  Airway Management Planned: Oral ETT  Additional Equipment:   Intra-op Plan:   Post-operative Plan: Extubation in OR  Informed Consent: I have reviewed the patients History and Physical, chart, labs and discussed the procedure including the risks, benefits and alternatives for the proposed anesthesia with the patient or authorized representative who has indicated his/her understanding and acceptance.       Plan Discussed with: Anesthesiologist, CRNA and Surgeon  Anesthesia Plan Comments: (  )        Anesthesia Quick Evaluation

## 2019-02-03 NOTE — H&P (Signed)
Jane Ramsey is an 43 y.o. female. [id number] with large intramural fibroid and uterine polyp for LAVH/BS.  Poss cystoscopy.  Pt irr VB and pelvic pressure.  D/W pt r/b/a and process of surgery, also d/w pt recovery.  Will proceed 6/11.    Pertinent Gynecological History: OB History: G43, P1021 SVD female 2002, 9#8, [place] No abn pap, last 11/19, HR HPV neg + Chl as teen   Menstrual History:  Patient's last menstrual period was 01/12/2019.    Past Medical History:  Diagnosis Date  . GERD (gastroesophageal reflux disease)   . Intramural uterine fibroid 02/03/2019  . Uterine polyp 02/03/2019    Past Surgical History:  Procedure Laterality Date  . WISDOM TOOTH EXTRACTION Bilateral 2004    Family History  Problem Relation Age of Onset  . Rheum arthritis Mother   . CAD Mother   . Cancer Father     Social History:  reports that she quit smoking about 7 years ago. Her smoking use included cigarettes. She has a 2.50 pack-year smoking history. She has never used smokeless tobacco. She reports current alcohol use. She reports that she does not use drugs. lactose intol, gluten free diet; adjunct professor, married  Allergies: No Known Allergies  Meds: Claritin, Protonix, Pepcid    Review of Systems  Constitutional: Negative.   HENT: Negative.   Eyes: Negative.   Respiratory: Negative.   Cardiovascular: Negative.   Gastrointestinal: Negative.   Genitourinary: Negative.        Pelvic pressure  Musculoskeletal: Negative.   Skin: Negative.   Neurological: Negative.   Psychiatric/Behavioral: Negative.     Last menstrual period 01/12/2019. Physical Exam  Constitutional: She is oriented to person, place, and time. She appears well-developed and well-nourished.  HENT:  Head: Normocephalic and atraumatic.  Cardiovascular: Normal rate and regular rhythm.  Respiratory: Effort normal and breath sounds normal. No respiratory distress. She has no wheezes.  GI: Soft. Bowel sounds are  normal. She exhibits no distension. There is no abdominal tenderness.  Musculoskeletal: Normal range of motion.  Neurological: She is alert and oriented to person, place, and time.  Skin: Skin is warm and dry.  Psychiatric: She has a normal mood and affect. Her behavior is normal.     [place] - 5cm intramural fibroid, poss polyp; nl ovaries Covid neg  Assessment/Plan: 43yo [id number] with irr VB, large fibroid LAVH/BS, poss cystoscopy D/w pt r/b/a of surgery, also POC Expectations after surgery  Miyoko Hashimi Bovard-Stuckert 02/03/2019, 1:33 PM

## 2019-02-04 ENCOUNTER — Other Ambulatory Visit: Payer: Self-pay

## 2019-02-04 ENCOUNTER — Ambulatory Visit (HOSPITAL_BASED_OUTPATIENT_CLINIC_OR_DEPARTMENT_OTHER): Payer: BC Managed Care – PPO | Admitting: Anesthesiology

## 2019-02-04 ENCOUNTER — Encounter (HOSPITAL_BASED_OUTPATIENT_CLINIC_OR_DEPARTMENT_OTHER)
Admission: RE | Disposition: A | Discharge status: Home / Self Care | Payer: Self-pay | Source: Home / Self Care | Attending: Obstetrics and Gynecology

## 2019-02-04 ENCOUNTER — Ambulatory Visit (HOSPITAL_BASED_OUTPATIENT_CLINIC_OR_DEPARTMENT_OTHER): Payer: BC Managed Care – PPO | Admitting: Physician Assistant

## 2019-02-04 ENCOUNTER — Observation Stay (HOSPITAL_BASED_OUTPATIENT_CLINIC_OR_DEPARTMENT_OTHER)
Admission: RE | Admit: 2019-02-04 | Discharge: 2019-02-05 | Disposition: A | Discharge status: Home / Self Care | Payer: BC Managed Care – PPO | Attending: Obstetrics and Gynecology | Admitting: Obstetrics and Gynecology

## 2019-02-04 ENCOUNTER — Encounter (HOSPITAL_BASED_OUTPATIENT_CLINIC_OR_DEPARTMENT_OTHER): Payer: Self-pay | Admitting: Emergency Medicine

## 2019-02-04 DIAGNOSIS — Z8261 Family history of arthritis: Secondary | ICD-10-CM | POA: Diagnosis not present

## 2019-02-04 DIAGNOSIS — Z791 Long term (current) use of non-steroidal anti-inflammatories (NSAID): Secondary | ICD-10-CM | POA: Diagnosis not present

## 2019-02-04 DIAGNOSIS — Z8249 Family history of ischemic heart disease and other diseases of the circulatory system: Secondary | ICD-10-CM | POA: Insufficient documentation

## 2019-02-04 DIAGNOSIS — Z809 Family history of malignant neoplasm, unspecified: Secondary | ICD-10-CM | POA: Diagnosis not present

## 2019-02-04 DIAGNOSIS — D251 Intramural leiomyoma of uterus: Secondary | ICD-10-CM | POA: Diagnosis not present

## 2019-02-04 DIAGNOSIS — K219 Gastro-esophageal reflux disease without esophagitis: Secondary | ICD-10-CM | POA: Insufficient documentation

## 2019-02-04 DIAGNOSIS — N926 Irregular menstruation, unspecified: Secondary | ICD-10-CM | POA: Diagnosis present

## 2019-02-04 DIAGNOSIS — Z87891 Personal history of nicotine dependence: Secondary | ICD-10-CM | POA: Diagnosis not present

## 2019-02-04 DIAGNOSIS — N840 Polyp of corpus uteri: Secondary | ICD-10-CM | POA: Insufficient documentation

## 2019-02-04 DIAGNOSIS — N800 Endometriosis of uterus: Principal | ICD-10-CM | POA: Insufficient documentation

## 2019-02-04 DIAGNOSIS — D259 Leiomyoma of uterus, unspecified: Secondary | ICD-10-CM | POA: Diagnosis present

## 2019-02-04 DIAGNOSIS — Z90710 Acquired absence of both cervix and uterus: Secondary | ICD-10-CM | POA: Diagnosis present

## 2019-02-04 HISTORY — PX: CYSTOSCOPY: SHX5120

## 2019-02-04 HISTORY — PX: LAPAROSCOPIC VAGINAL HYSTERECTOMY WITH SALPINGECTOMY: SHX6680

## 2019-02-04 LAB — TYPE AND SCREEN
ABO/RH(D): A NEG
Antibody Screen: NEGATIVE

## 2019-02-04 LAB — POCT PREGNANCY, URINE: Preg Test, Ur: NEGATIVE

## 2019-02-04 SURGERY — HYSTERECTOMY, VAGINAL, LAPAROSCOPY-ASSISTED, WITH SALPINGECTOMY
Anesthesia: General | Site: Vagina

## 2019-02-04 MED ORDER — SCOPOLAMINE 1 MG/3DAYS TD PT72
MEDICATED_PATCH | TRANSDERMAL | Status: DC | PRN
  Administered 2019-02-04: 1 via TRANSDERMAL

## 2019-02-04 MED ORDER — DEXAMETHASONE SODIUM PHOSPHATE 10 MG/ML IJ SOLN
INTRAMUSCULAR | Status: DC | PRN
  Administered 2019-02-04: 10 mg via INTRAVENOUS

## 2019-02-04 MED ORDER — OXYCODONE HCL 5 MG PO TABS
ORAL_TABLET | ORAL | Status: AC
  Filled 2019-02-04: qty 1

## 2019-02-04 MED ORDER — ONDANSETRON HCL 4 MG/2ML IJ SOLN
4.0000 mg | Freq: Once | INTRAMUSCULAR | Status: DC | PRN
  Filled 2019-02-04: qty 2

## 2019-02-04 MED ORDER — OXYCODONE-ACETAMINOPHEN 5-325 MG PO TABS
1.0000 | ORAL_TABLET | ORAL | Status: DC | PRN
  Filled 2019-02-04: qty 2

## 2019-02-04 MED ORDER — OXYCODONE HCL 5 MG PO TABS
ORAL_TABLET | ORAL | Status: AC
  Filled 2019-02-04: qty 2

## 2019-02-04 MED ORDER — IBUPROFEN 800 MG PO TABS
800.0000 mg | ORAL_TABLET | Freq: Three times a day (TID) | ORAL | Status: DC | PRN
  Filled 2019-02-04: qty 1

## 2019-02-04 MED ORDER — ONDANSETRON HCL 4 MG/2ML IJ SOLN
4.0000 mg | Freq: Four times a day (QID) | INTRAMUSCULAR | Status: DC | PRN
  Filled 2019-02-04: qty 2

## 2019-02-04 MED ORDER — SCOPOLAMINE 1 MG/3DAYS TD PT72
MEDICATED_PATCH | TRANSDERMAL | Status: AC
  Filled 2019-02-04: qty 1

## 2019-02-04 MED ORDER — ALUM & MAG HYDROXIDE-SIMETH 200-200-20 MG/5ML PO SUSP
30.0000 mL | ORAL | Status: DC | PRN
  Filled 2019-02-04: qty 30

## 2019-02-04 MED ORDER — HYDROMORPHONE 1 MG/ML IV SOLN
INTRAVENOUS | Status: DC
  Administered 2019-02-04: 2.6 mg via INTRAVENOUS
  Administered 2019-02-04: 1.4 mg via INTRAVENOUS
  Administered 2019-02-04: 30 mg via INTRAVENOUS
  Administered 2019-02-04: 0.2 mg via INTRAVENOUS
  Filled 2019-02-04 (×2): qty 30

## 2019-02-04 MED ORDER — LIDOCAINE 2% (20 MG/ML) 5 ML SYRINGE
INTRAMUSCULAR | Status: DC | PRN
  Administered 2019-02-04: 60 mg via INTRAVENOUS

## 2019-02-04 MED ORDER — FENTANYL CITRATE (PF) 100 MCG/2ML IJ SOLN
25.0000 ug | INTRAMUSCULAR | Status: DC | PRN
  Filled 2019-02-04: qty 1

## 2019-02-04 MED ORDER — SUGAMMADEX SODIUM 200 MG/2ML IV SOLN
INTRAVENOUS | Status: DC | PRN
  Administered 2019-02-04: 200 mg via INTRAVENOUS

## 2019-02-04 MED ORDER — MEPERIDINE HCL 25 MG/ML IJ SOLN
6.2500 mg | INTRAMUSCULAR | Status: DC | PRN
  Administered 2019-02-04 (×2): 6.25 mg via INTRAVENOUS
  Filled 2019-02-04: qty 1

## 2019-02-04 MED ORDER — FENTANYL CITRATE (PF) 100 MCG/2ML IJ SOLN
INTRAMUSCULAR | Status: DC | PRN
  Administered 2019-02-04 (×2): 100 ug via INTRAVENOUS
  Administered 2019-02-04: 50 ug via INTRAVENOUS

## 2019-02-04 MED ORDER — PROPOFOL 10 MG/ML IV BOLUS
INTRAVENOUS | Status: AC
  Filled 2019-02-04: qty 40

## 2019-02-04 MED ORDER — CELECOXIB 200 MG PO CAPS
ORAL_CAPSULE | ORAL | Status: AC
  Filled 2019-02-04: qty 1

## 2019-02-04 MED ORDER — ONDANSETRON HCL 4 MG PO TABS
4.0000 mg | ORAL_TABLET | Freq: Four times a day (QID) | ORAL | Status: DC | PRN
  Filled 2019-02-04: qty 1

## 2019-02-04 MED ORDER — ACETAMINOPHEN 160 MG/5ML PO SOLN
1000.0000 mg | Freq: Four times a day (QID) | ORAL | Status: DC | PRN
  Administered 2019-02-04: 1000 mg via ORAL
  Filled 2019-02-04 (×2): qty 40.6

## 2019-02-04 MED ORDER — KETOROLAC TROMETHAMINE 30 MG/ML IJ SOLN
INTRAMUSCULAR | Status: DC | PRN
  Administered 2019-02-04: 30 mg via INTRAVENOUS

## 2019-02-04 MED ORDER — PANTOPRAZOLE SODIUM 40 MG PO TBEC
40.0000 mg | DELAYED_RELEASE_TABLET | ORAL | Status: DC
  Filled 2019-02-04: qty 1

## 2019-02-04 MED ORDER — CEFAZOLIN SODIUM-DEXTROSE 2-4 GM/100ML-% IV SOLN
INTRAVENOUS | Status: AC
  Filled 2019-02-04: qty 100

## 2019-02-04 MED ORDER — NALOXONE HCL 0.4 MG/ML IJ SOLN
0.4000 mg | INTRAMUSCULAR | Status: DC | PRN
  Filled 2019-02-04: qty 1

## 2019-02-04 MED ORDER — OXYCODONE-ACETAMINOPHEN 5-325 MG PO TABS
ORAL_TABLET | ORAL | Status: AC
  Filled 2019-02-04: qty 1

## 2019-02-04 MED ORDER — MEPERIDINE HCL 25 MG/ML IJ SOLN
INTRAMUSCULAR | Status: AC
  Filled 2019-02-04: qty 1

## 2019-02-04 MED ORDER — OXYCODONE HCL 5 MG/5ML PO SOLN
5.0000 mg | Freq: Once | ORAL | Status: DC | PRN
  Filled 2019-02-04: qty 5

## 2019-02-04 MED ORDER — OXYCODONE HCL 5 MG PO TABS
5.0000 mg | ORAL_TABLET | ORAL | Status: DC | PRN
  Administered 2019-02-04 – 2019-02-05 (×3): 10 mg via ORAL
  Filled 2019-02-04: qty 2

## 2019-02-04 MED ORDER — OXYCODONE HCL 5 MG PO TABS
5.0000 mg | ORAL_TABLET | Freq: Once | ORAL | Status: DC | PRN
  Filled 2019-02-04: qty 1

## 2019-02-04 MED ORDER — ACETAMINOPHEN 325 MG PO TABS
325.0000 mg | ORAL_TABLET | ORAL | Status: DC | PRN
  Filled 2019-02-04: qty 2

## 2019-02-04 MED ORDER — SODIUM CHLORIDE 0.9 % IR SOLN
Status: DC | PRN
  Administered 2019-02-04: 600 mL

## 2019-02-04 MED ORDER — ONDANSETRON HCL 4 MG/2ML IJ SOLN
INTRAMUSCULAR | Status: DC | PRN
  Administered 2019-02-04: 4 mg via INTRAVENOUS

## 2019-02-04 MED ORDER — SUGAMMADEX SODIUM 200 MG/2ML IV SOLN
INTRAVENOUS | Status: AC
  Filled 2019-02-04: qty 2

## 2019-02-04 MED ORDER — LACTATED RINGERS IV SOLN
INTRAVENOUS | Status: DC
  Administered 2019-02-04 (×3): via INTRAVENOUS
  Filled 2019-02-04: qty 1000

## 2019-02-04 MED ORDER — DIPHENHYDRAMINE HCL 12.5 MG/5ML PO ELIX
12.5000 mg | ORAL_SOLUTION | Freq: Four times a day (QID) | ORAL | Status: DC | PRN
  Filled 2019-02-04: qty 5

## 2019-02-04 MED ORDER — CEFAZOLIN SODIUM-DEXTROSE 2-4 GM/100ML-% IV SOLN
2.0000 g | INTRAVENOUS | Status: AC
  Administered 2019-02-04: 07:00:00 2 g via INTRAVENOUS
  Filled 2019-02-04: qty 100

## 2019-02-04 MED ORDER — SODIUM CHLORIDE 0.9 % IR SOLN
Status: DC | PRN
  Administered 2019-02-04: 1000 mL via INTRAVESICAL

## 2019-02-04 MED ORDER — SIMETHICONE 80 MG PO CHEW
80.0000 mg | CHEWABLE_TABLET | Freq: Four times a day (QID) | ORAL | Status: DC | PRN
  Filled 2019-02-04: qty 1

## 2019-02-04 MED ORDER — CELECOXIB 200 MG PO CAPS
200.0000 mg | ORAL_CAPSULE | Freq: Two times a day (BID) | ORAL | Status: DC
  Filled 2019-02-04: qty 1

## 2019-02-04 MED ORDER — MENTHOL 3 MG MT LOZG
1.0000 | LOZENGE | OROMUCOSAL | Status: DC | PRN
  Filled 2019-02-04: qty 9

## 2019-02-04 MED ORDER — FLUORESCEIN SODIUM 10 % IV SOLN
INTRAVENOUS | Status: DC | PRN
  Administered 2019-02-04: 300 mg via INTRAVENOUS

## 2019-02-04 MED ORDER — VASOPRESSIN 20 UNIT/ML IV SOLN
INTRAVENOUS | Status: DC | PRN
  Administered 2019-02-04: 30 mL via INTRAMUSCULAR

## 2019-02-04 MED ORDER — ROCURONIUM BROMIDE 10 MG/ML (PF) SYRINGE
PREFILLED_SYRINGE | INTRAVENOUS | Status: DC | PRN
  Administered 2019-02-04: 60 mg via INTRAVENOUS
  Administered 2019-02-04 (×2): 10 mg via INTRAVENOUS

## 2019-02-04 MED ORDER — GUAIFENESIN 100 MG/5ML PO SOLN
15.0000 mL | ORAL | Status: DC | PRN
  Filled 2019-02-04: qty 15

## 2019-02-04 MED ORDER — FAMOTIDINE 40 MG PO TABS
40.0000 mg | ORAL_TABLET | Freq: Every day | ORAL | Status: DC
  Administered 2019-02-04: 40 mg via ORAL
  Filled 2019-02-04 (×2): qty 1

## 2019-02-04 MED ORDER — ACETAMINOPHEN 10 MG/ML IV SOLN
INTRAVENOUS | Status: DC | PRN
  Administered 2019-02-04: 1000 mg via INTRAVENOUS

## 2019-02-04 MED ORDER — BUPIVACAINE HCL (PF) 0.25 % IJ SOLN
INTRAMUSCULAR | Status: DC | PRN
  Administered 2019-02-04: 10 mL

## 2019-02-04 MED ORDER — FLUORESCEIN SODIUM 10 % IV SOLN
INTRAVENOUS | Status: AC
  Filled 2019-02-04: qty 10

## 2019-02-04 MED ORDER — SODIUM CHLORIDE 0.9% FLUSH
9.0000 mL | INTRAVENOUS | Status: DC | PRN
  Filled 2019-02-04: qty 10

## 2019-02-04 MED ORDER — LORATADINE 10 MG PO TABS
10.0000 mg | ORAL_TABLET | Freq: Every day | ORAL | Status: DC
  Filled 2019-02-04 (×2): qty 1

## 2019-02-04 MED ORDER — ACETAMINOPHEN 10 MG/ML IV SOLN
INTRAVENOUS | Status: AC
  Filled 2019-02-04: qty 100

## 2019-02-04 MED ORDER — ACETAMINOPHEN 160 MG/5ML PO SOLN
325.0000 mg | ORAL | Status: DC | PRN
  Filled 2019-02-04: qty 20.3

## 2019-02-04 MED ORDER — PROPOFOL 10 MG/ML IV BOLUS
INTRAVENOUS | Status: DC | PRN
  Administered 2019-02-04: 40 mg via INTRAVENOUS
  Administered 2019-02-04: 150 mg via INTRAVENOUS

## 2019-02-04 MED ORDER — FENTANYL CITRATE (PF) 250 MCG/5ML IJ SOLN
INTRAMUSCULAR | Status: AC
  Filled 2019-02-04: qty 5

## 2019-02-04 MED ORDER — DIPHENHYDRAMINE HCL 50 MG/ML IJ SOLN
12.5000 mg | Freq: Four times a day (QID) | INTRAMUSCULAR | Status: DC | PRN
  Filled 2019-02-04: qty 0.25

## 2019-02-04 MED ORDER — MIDAZOLAM HCL 2 MG/2ML IJ SOLN
INTRAMUSCULAR | Status: DC | PRN
  Administered 2019-02-04: 2 mg via INTRAVENOUS

## 2019-02-04 MED ORDER — MIDAZOLAM HCL 2 MG/2ML IJ SOLN
INTRAMUSCULAR | Status: AC
  Filled 2019-02-04: qty 2

## 2019-02-04 MED ORDER — LACTATED RINGERS IV SOLN
INTRAVENOUS | Status: DC
  Administered 2019-02-04: 18:00:00 via INTRAVENOUS
  Filled 2019-02-04 (×2): qty 1000

## 2019-02-04 MED ORDER — CELECOXIB 200 MG PO CAPS
200.0000 mg | ORAL_CAPSULE | Freq: Two times a day (BID) | ORAL | Status: DC
  Administered 2019-02-04: 200 mg via ORAL
  Filled 2019-02-04 (×2): qty 1

## 2019-02-04 SURGICAL SUPPLY — 53 items
APPLICATOR ARISTA FLEXITIP XL (MISCELLANEOUS) IMPLANT
CABLE HIGH FREQUENCY MONO STRZ (ELECTRODE) IMPLANT
CONT SPECI 4OZ STER CLIK (MISCELLANEOUS) ×3 IMPLANT
COVER BACK TABLE 60X90IN (DRAPES) ×3 IMPLANT
COVER MAYO STAND STRL (DRAPES) ×6 IMPLANT
COVER WAND RF STERILE (DRAPES) ×3 IMPLANT
DECANTER SPIKE VIAL GLASS SM (MISCELLANEOUS) ×9 IMPLANT
DERMABOND ADVANCED (GAUZE/BANDAGES/DRESSINGS) ×1
DERMABOND ADVANCED .7 DNX12 (GAUZE/BANDAGES/DRESSINGS) ×2 IMPLANT
DRSG COVADERM PLUS 2X2 (GAUZE/BANDAGES/DRESSINGS) IMPLANT
DRSG OPSITE POSTOP 3X4 (GAUZE/BANDAGES/DRESSINGS) IMPLANT
DURAPREP 26ML APPLICATOR (WOUND CARE) ×3 IMPLANT
ELECT REM PT RETURN 9FT ADLT (ELECTROSURGICAL) ×3
ELECTRODE REM PT RTRN 9FT ADLT (ELECTROSURGICAL) ×2 IMPLANT
FILTER SMOKE EVAC LAPAROSHD (FILTER) ×3 IMPLANT
GAUZE 4X4 16PLY RFD (DISPOSABLE) ×3 IMPLANT
GLOVE BIO SURGEON STRL SZ 6.5 (GLOVE) ×21 IMPLANT
GLOVE BIO SURGEON STRL SZ7 (GLOVE) ×6 IMPLANT
GLOVE BIO SURGEON STRL SZ7.5 (GLOVE) ×6 IMPLANT
GLOVE BIOGEL PI IND STRL 6.5 (GLOVE) ×2 IMPLANT
GLOVE BIOGEL PI IND STRL 7.5 (GLOVE) ×6 IMPLANT
GLOVE BIOGEL PI INDICATOR 6.5 (GLOVE) ×1
GLOVE BIOGEL PI INDICATOR 7.5 (GLOVE) ×3
GLOVE SURG SS PI 7.0 STRL IVOR (GLOVE) ×6 IMPLANT
HEMOSTAT ARISTA ABSORB 3G PWDR (HEMOSTASIS) IMPLANT
NEEDLE FILTER BLUNT 18X 1/2SAF (NEEDLE) ×1
NEEDLE FILTER BLUNT 18X1 1/2 (NEEDLE) ×2 IMPLANT
NEEDLE INSUFFLATION 120MM (ENDOMECHANICALS) ×3 IMPLANT
NS IRRIG 1000ML POUR BTL (IV SOLUTION) ×3 IMPLANT
PACK LAVH (CUSTOM PROCEDURE TRAY) ×3 IMPLANT
PACK ROBOTIC GOWN (GOWN DISPOSABLE) ×3 IMPLANT
PACK TRENDGUARD 450 HYBRID PRO (MISCELLANEOUS) ×2 IMPLANT
PAD OB MATERNITY 4.3X12.25 (PERSONAL CARE ITEMS) ×3 IMPLANT
PROTECTOR NERVE ULNAR (MISCELLANEOUS) ×6 IMPLANT
SET IRRIG TUBING LAPAROSCOPIC (IRRIGATION / IRRIGATOR) ×3 IMPLANT
SET IRRIG Y TYPE TUR BLADDER L (SET/KITS/TRAYS/PACK) ×3 IMPLANT
SHEARS HARMONIC ACE PLUS 36CM (ENDOMECHANICALS) ×3 IMPLANT
SUT VIC AB 1 CT1 18XBRD ANBCTR (SUTURE) ×4 IMPLANT
SUT VIC AB 1 CT1 8-18 (SUTURE) ×2
SUT VIC AB 1-0 CT2 27 (SUTURE) ×3 IMPLANT
SUT VIC AB 2-0 CT1 (SUTURE) ×6 IMPLANT
SUT VIC AB 3-0 SH 27 (SUTURE)
SUT VIC AB 3-0 SH 27X BRD (SUTURE) IMPLANT
SUT VIC AB 4-0 PS2 27 (SUTURE) ×6 IMPLANT
SUT VICRYL 0 TIES 12 18 (SUTURE) ×3 IMPLANT
SUT VICRYL 0 UR6 27IN ABS (SUTURE) IMPLANT
SYR 5ML LL (SYRINGE) ×3 IMPLANT
TOWEL OR 17X26 10 PK STRL BLUE (TOWEL DISPOSABLE) ×3 IMPLANT
TRAY FOLEY W/BAG SLVR 14FR (SET/KITS/TRAYS/PACK) ×3 IMPLANT
TRENDGUARD 450 HYBRID PRO PACK (MISCELLANEOUS) ×3
TROCAR BLADELESS OPT 5 100 (ENDOMECHANICALS) ×9 IMPLANT
TUBING EVAC SMOKE HEATED PNEUM (TUBING) ×3 IMPLANT
WARMER LAPAROSCOPE (MISCELLANEOUS) ×3 IMPLANT

## 2019-02-04 NOTE — Anesthesia Postprocedure Evaluation (Signed)
Anesthesia Post Note  Patient: Jane Ramsey  Procedure(s) Performed: LAPAROSCOPIC ASSISTED VAGINAL HYSTERECTOMY WITH SALPINGECTOMY (Bilateral Vagina ) CYSTOSCOPY (N/A Bladder)     Patient location during evaluation: PACU Anesthesia Type: General Level of consciousness: awake and alert Pain management: pain level controlled Vital Signs Assessment: post-procedure vital signs reviewed and stable Respiratory status: spontaneous breathing, nonlabored ventilation, respiratory function stable and patient connected to nasal cannula oxygen Cardiovascular status: blood pressure returned to baseline and stable Postop Assessment: no apparent nausea or vomiting Anesthetic complications: no    Last Vitals:  Vitals:   02/04/19 1130 02/04/19 1211  BP: 106/74 104/75  Pulse: 100 (!) 103  Resp: 15   Temp: 36.6 C 36.7 C  SpO2: 100% 96%    Last Pain:  Vitals:   02/04/19 1211  TempSrc:   PainSc: 7                  Loyde Orth

## 2019-02-04 NOTE — Interval H&P Note (Signed)
History and Physical Interval Note:  02/04/2019 7:11 AM  Jane Ramsey  has presented today for surgery, with the diagnosis of uterine leiomyoma, polyp of corpus uteri.  The various methods of treatment have been discussed with the patient and family. After consideration of risks, benefits and other options for treatment, the patient has consented to  Procedure(s): LAPAROSCOPIC ASSISTED VAGINAL HYSTERECTOMY WITH SALPINGECTOMY (Bilateral) CYSTOSCOPY (N/A) as a surgical intervention.  The patient's history has been reviewed, patient examined, no change in status, stable for surgery.  I have reviewed the patient's chart and labs.  Questions were answered to the patient's satisfaction.     Cherissa Hook Bovard-Stuckert

## 2019-02-04 NOTE — Progress Notes (Signed)
PCA waste: 26 mL of Dilaudid PCA syringe wasted, witnessed by [employer], RN into stericycle. Patient bridged over to oral pain medication upon discontinuation of PCA therapy. Patient currently rated her pain a 3/10 and reports improvement in pain management. Patient resting comfortably, will continue to monitor patient. Lenna Sciara, RN 9:40 PM

## 2019-02-04 NOTE — Anesthesia Procedure Notes (Signed)
Procedure Name: Intubation Date/Time: 02/04/2019 7:30 AM Performed by: Wanita Chamberlain, CRNA Pre-anesthesia Checklist: Patient identified, Emergency Drugs available, Suction available and Patient being monitored Patient Re-evaluated:Patient Re-evaluated prior to induction Oxygen Delivery Method: Circle system utilized Preoxygenation: Pre-oxygenation with 100% oxygen Induction Type: IV induction Ventilation: Mask ventilation without difficulty Laryngoscope Size: Mac and 3 Grade View: Grade II Tube type: Oral Number of attempts: 1 Airway Equipment and Method: Oral airway Placement Confirmation: ETT inserted through vocal cords under direct vision,  positive ETCO2,  CO2 detector and breath sounds checked- equal and bilateral Secured at: 21 cm Tube secured with: Tape Dental Injury: Teeth and Oropharynx as per pre-operative assessment

## 2019-02-04 NOTE — Brief Op Note (Signed)
02/04/2019  9:53 AM  PATIENT:  Jane Ramsey  43 y.o. female  PRE-OPERATIVE DIAGNOSIS:  uterine leiomyoma, polyp of corpus uteri  POST-OPERATIVE DIAGNOSIS:  uterine leiomyoma, polyp of corpus uteri  PROCEDURE:  Procedure(s): LAPAROSCOPIC ASSISTED VAGINAL HYSTERECTOMY WITH SALPINGECTOMY (Bilateral) CYSTOSCOPY (N/A)  SURGEON:  Surgeon(s) and Role:    * Bovard-Stuckert, Gage Weant, MD - Primary    * Banga, Cecilia Worema, DO - Assisting  ANESTHESIA:   local and general  EBL:  150 mL cc uop and IVF per anesthesia  BLOOD ADMINISTERED:none  DRAINS: Urinary Catheter (Foley)   LOCAL MEDICATIONS USED:  MARCAINE     SPECIMEN:  Source of Specimen:  uterus, cervix, B fallopian tubes  DISPOSITION OF SPECIMEN:  PATHOLOGY  COUNTS:  YES  TOURNIQUET:  * No tourniquets in log *  DICTATION: .Other Dictation: Dictation Number [id number]  PLAN OF CARE: Admit for overnight observation  PATIENT DISPOSITION:  PACU - hemodynamically stable.   Delay start of Pharmacological VTE agent (>24hrs) due to surgical blood loss or risk of bleeding: not applicable

## 2019-02-04 NOTE — Progress Notes (Signed)
Patient c/o 6/10 abdominal pain. States she cannot swallow pills and cannot take prescribed analgesics. Also reports ibuprofen aggravates GERD. MD notified, new orders obtained for liquid tylenol,celebrex, and oxy IR.Orders enacted, will continue to monitor.  Lyndel Pleasure, RN

## 2019-02-04 NOTE — Progress Notes (Signed)
Day of Surgery Procedure(s) (LRB): LAPAROSCOPIC ASSISTED VAGINAL HYSTERECTOMY WITH SALPINGECTOMY (Bilateral) CYSTOSCOPY (N/A)  Subjective: Patient reports incisional pain - has started PCA and tolerating PO.    Objective: I have reviewed patient's vital signs, intake and output and medications.  General: alert and no distress Resp: clear to auscultation bilaterally Cardio: regular rate and rhythm GI: soft, non-tender; bowel sounds normal; no masses,  no organomegaly Extremities: extremities normal, atraumatic, no cyanosis or edema Inc C/D/I  Assessment: s/p Procedure(s): LAPAROSCOPIC ASSISTED VAGINAL HYSTERECTOMY WITH SALPINGECTOMY (Bilateral) CYSTOSCOPY (N/A): stable, progressing well and tolerating diet  Plan: Advance diet Encourage ambulation Discontinue IV fluids and d/c foley when tiol po and ambulating Can take celebrex by opening capsule.   LOS: 0 days    Jane Ramsey 02/04/2019, 4:46 PM

## 2019-02-04 NOTE — Transfer of Care (Signed)
Immediate Anesthesia Transfer of Care Note  Patient: Jane Ramsey  Procedure(s) Performed: LAPAROSCOPIC ASSISTED VAGINAL HYSTERECTOMY WITH SALPINGECTOMY (Bilateral Vagina ) CYSTOSCOPY (N/A Bladder)  Patient Location: PACU  Anesthesia Type:General  Level of Consciousness: awake, alert , oriented and patient cooperative  Airway & Oxygen Therapy: Patient Spontanous Breathing and Patient connected to nasal cannula oxygen  Post-op Assessment: Report given to RN and Post -op Vital signs reviewed and stable  Post vital signs: Reviewed and stable  Last Vitals:  Vitals Value Taken Time  BP    Temp    Pulse 115 02/04/19 1006  Resp 13 02/04/19 1006  SpO2 100 % 02/04/19 1006  Vitals shown include unvalidated device data.  Last Pain:  Vitals:   02/04/19 0542  TempSrc: Oral      Patients Stated Pain Goal: 4 (72/89/79 1504)  Complications: No apparent anesthesia complications

## 2019-02-05 ENCOUNTER — Encounter (HOSPITAL_BASED_OUTPATIENT_CLINIC_OR_DEPARTMENT_OTHER): Payer: Self-pay | Admitting: Obstetrics and Gynecology

## 2019-02-05 DIAGNOSIS — N800 Endometriosis of uterus: Secondary | ICD-10-CM | POA: Diagnosis not present

## 2019-02-05 LAB — CBC
HCT: 28.6 % — ABNORMAL LOW (ref 36.0–46.0)
Hemoglobin: 8.9 g/dL — ABNORMAL LOW (ref 12.0–15.0)
MCH: 28.7 pg (ref 26.0–34.0)
MCHC: 31.1 g/dL (ref 30.0–36.0)
MCV: 92.3 fL (ref 80.0–100.0)
Platelets: 213 10*3/uL (ref 150–400)
RBC: 3.1 MIL/uL — ABNORMAL LOW (ref 3.87–5.11)
RDW: 14.4 % (ref 11.5–15.5)
WBC: 10.9 10*3/uL — ABNORMAL HIGH (ref 4.0–10.5)
nRBC: 0 % (ref 0.0–0.2)

## 2019-02-05 LAB — BASIC METABOLIC PANEL
Anion gap: 8 (ref 5–15)
BUN: 6 mg/dL (ref 6–20)
CO2: 24 mmol/L (ref 22–32)
Calcium: 8.2 mg/dL — ABNORMAL LOW (ref 8.9–10.3)
Chloride: 103 mmol/L (ref 98–111)
Creatinine, Ser: 0.65 mg/dL (ref 0.44–1.00)
GFR calc Af Amer: >60 mL/min (ref >60)
GFR calc non Af Amer: >60 mL/min (ref >60)
Glucose, Bld: 111 mg/dL — ABNORMAL HIGH (ref 70–99)
Potassium: 3.9 mmol/L (ref 3.5–5.1)
Sodium: 135 mmol/L (ref 135–145)

## 2019-02-05 MED ORDER — CELECOXIB 200 MG PO CAPS: 200.0000 mg | ORAL_CAPSULE | Freq: Two times a day (BID) | ORAL | 1 refills | Status: AC | PRN

## 2019-02-05 MED ORDER — OXYCODONE HCL 5 MG PO TABS
ORAL_TABLET | ORAL | Status: AC
  Filled 2019-02-05: qty 2

## 2019-02-05 MED ORDER — OXYCODONE HCL 5 MG PO TABS: 5.0000 mg | ORAL_TABLET | Freq: Four times a day (QID) | ORAL | 0 refills | Status: AC | PRN

## 2019-02-05 NOTE — Discharge Summary (Signed)
Physician Discharge Summary  Patient ID: Jane Ramsey MRN: [id number] DOB/AGE: 45-01-1976 45 y.o.  Admit date: 02/04/2019 Discharge date: 02/05/2019  Admission Diagnoses:  Discharge Diagnoses:  Principal Problem:   Intramural uterine fibroid Active Problems:   Uterine polyp   Irregular bleeding   S/P laparoscopic assisted vaginal hysterectomy (LAVH)   Discharged Condition: good  Hospital Course: Admitted for LAVH/BS/cystoscopy - underwent surgery w no complication.  D/C to home POD#1 ambulating, voiding w pain controlled.  AStable labs  Consults: None  Significant Diagnostic Studies: labs: CBC, CMP  Treatments: IV hydration and analgesia: PCA and PO  Discharge Exam: Blood pressure 93/63, pulse 83, temperature 98.1 F (36.7 C), resp. rate 18, height 5\' 4"  (1.626 m), weight 71.9 kg, last menstrual period 01/12/2019, SpO2 98 %. General appearance: alert, cooperative and no distress Resp: clear to auscultation bilaterally Cardio: regular rate and rhythm GI: soft, non-tender; bowel sounds normal; no masses,  no organomegaly Incision/Wound:C/D/I  Disposition: Discharge disposition: 01-Home or Self Care       Discharge Instructions    Call MD for:  persistant nausea and vomiting   Complete by: As directed    Call MD for:  redness, tenderness, or signs of infection (pain, swelling, redness, odor or green/yellow discharge around incision site)   Complete by: As directed    Call MD for:  severe uncontrolled pain   Complete by: As directed    Diet - low sodium heart healthy   Complete by: As directed    Discharge instructions   Complete by: As directed    Call [phone] with questions or problems   Driving Restrictions   Complete by: As directed    While taking strong pain medicine   Increase activity slowly   Complete by: As directed    Lifting restrictions   Complete by: As directed    No greater than 10-15lbs for 6 weeks   May shower / Bathe   Complete by: As  directed    May walk up steps   Complete by: As directed    Sexual Activity Restrictions   Complete by: As directed    Pelvic rest - no douching, tampons or sex for 6 weeks     Allergies as of 02/05/2019   No Known Allergies     Medication List    TAKE these medications   celecoxib 200 MG capsule Commonly known as: CELEBREX Take 1 capsule (200 mg total) by mouth 2 (two) times daily as needed.   famotidine 40 MG tablet Commonly known as: PEPCID Take 40 mg by mouth at bedtime.   Fish Oil 1000 MG Caps Take 1,000 mg by mouth daily.   loratadine 10 MG tablet Commonly known as: CLARITIN Take 10 mg by mouth daily.   oxyCODONE 5 MG immediate release tablet Commonly known as: Oxy IR/ROXICODONE Take 1-2 tablets (5-10 mg total) by mouth every 6 (six) hours as needed for severe pain.   pantoprazole 40 MG tablet Commonly known as: PROTONIX Take 40 mg by mouth every morning.      Follow-up Information    Bovard-Stuckert, Sherlene Rickel, MD. Schedule an appointment as soon as possible for a visit in 2 week(s).   Specialty: Obstetrics and Gynecology Why: Incision check and pathology; 6 weeks postop check Contact information: [address] [place] [address] [phone]           Signed: Janyth Contes 02/05/2019, 7:12 AM

## 2019-02-05 NOTE — Op Note (Signed)
NAMEJALYNNE, PERSICO MEDICAL RECORD QA:[id number] ACCOUNT [id number] DATE OF BIRTH:1975-11-27 FACILITY: [facility] LOCATION: [facility] PHYSICIAN:Rayanne Padmanabhan BOVARD-STUCKERT, MD  OPERATIVE REPORT  DATE OF PROCEDURE:  02/04/2019  PREOPERATIVE DIAGNOSIS:  A 5 cm intramural uterine fibroid, followup of polyp of uterus, irregular bleeding.  POSTOPERATIVE DIAGNOSIS:  A 5 cm intramural uterine fibroid, followup of polyp of uterus, irregular bleeding, status post laparoscopically assisted vaginal hysterectomy.  PROCEDURE:  Laparoscopically assisted vaginal hysterectomy with salpingectomy and cystoscopy.  SURGEON:  Janyth Contes, MD  ASSISTANT:  Carlynn Purl, DO  ANESTHESIA:  Local and general.  ESTIMATED BLOOD LOSS:  150 mL.  URINE OUTPUT AND IV FLUIDS: Per anesthesia.  COMPLICATIONS:  None.  PATHOLOGY:  Uterus, cervix and bilateral tubes.  DESCRIPTION OF PROCEDURE:  After informed consent was reviewed with the patient including risks, benefits and alternatives of the surgical procedure, she was transported to the operating room and placed on the table in supine position.  General  anesthesia was induced and found to be adequate.  She was then placed in the [place] stirrups, prepped and draped in the normal sterile fashion.  A Foley catheter was sterilely placed.  After an appropriate timeout was performed, an open-sided speculum  was used to place a Hulka tenaculum.  Gloves and gown were changed.  Attention was turned to the abdominal portion of the case, and approximately 5 mm horizontal infraumbilical incision was made using a Veress needle.  Pneumoperitoneum was obtained after  passing the hanging drop test.  The opening pressure was 4 mmHg.  The accessory ports were placed on both the right and left under direct visualization, carefully avoiding the vessels.  The tube was excised from the level of the fimbria with Harmonic  scalpel.  The round ligaments and cardinal ligament pedicles were  made with the Harmonic scalpel to the level of the bladder flap.  The bladder flap was created from both the right and left, overlapping in the midline.  The procedure was completed on both  right and left side.  Bleeding was controlled on the right side.  Attention was turned to the vaginal portion of the case.  Using a heavy weighted speculum and a Deaver retractor, the cervix was visualized, grasped with a Jacobs tenaculum and instilled  with vasopressin.  The cervix was then circumscribed with Bovie cautery.  Attempt was made to enter anteriorly.  This was done successfully.  Posteriorly, the cul-de-sac was entered, and the uterosacral ligaments were plicated and held in a stepwise  fashion.  Pedicles were made to meet up with the portion of the case that had been done intraabdominally.  The uterus and cervix were delivered.  The pedicles were noted to be hemostatic.  The posterior cuff was reefed as there was a small amount of  bleeding.  The uterosacral ligaments were plicated and the held sutures were tied together.  The vagina was closed with 2-0 Vicryl.  The bladder was instilled with 200 mL, and the patient had been given fluorescein.  Jets were seen from bilateral  ureters.  Catheter was replaced.  Attention was turned to the abdominal portion.  Pelvic survey performed revealed normal liver edge, normal appendix, and the pedicles were noted to be hemostatic.  There was irrigation performed, and the ports were  removed under direct visualization.  The air was evacuated from the abdomen.  The ports were closed with 4-0 Vicryl and Dermabond.  The patient tolerated the procedure well.  Sponge, lap and needle counts were correct x2 per the  operating staff.  LN/NUANCE  D:02/04/2019 T:02/04/2019 JOB:[id number]

## 2019-02-05 NOTE — Progress Notes (Signed)
1 Day Post-Op Procedure(s) (LRB): LAPAROSCOPIC ASSISTED VAGINAL HYSTERECTOMY WITH SALPINGECTOMY (Bilateral) CYSTOSCOPY (N/A)  Subjective: Patient reports incisional pain, tolerating PO and no problems voiding.    Objective: I have reviewed patient's vital signs, intake and output and medications.  General: alert and no distress Resp: clear to auscultation bilaterally Cardio: regular rate and rhythm GI: soft, non-tender; bowel sounds normal; no masses,  no organomegaly and incision: clean, dry and intact Extremities: extremities normal, atraumatic, no cyanosis or edema  Assessment: s/p Procedure(s): LAPAROSCOPIC ASSISTED VAGINAL HYSTERECTOMY WITH SALPINGECTOMY (Bilateral) CYSTOSCOPY (N/A): stable, progressing well and tolerating diet  Plan: Encourage ambulation Discharge home when ambulating, voiding, pain controlled.  F/u 2 and 6 weeks  LOS: 0 days    Jane Ramsey 02/05/2019, 7:06 AM

## 2019-02-10 ENCOUNTER — Other Ambulatory Visit (HOSPITAL_BASED_OUTPATIENT_CLINIC_OR_DEPARTMENT_OTHER): Payer: Self-pay | Admitting: Hematology & Oncology

## 2019-02-10 DIAGNOSIS — C801 Malignant (primary) neoplasm, unspecified: Secondary | ICD-10-CM

## 2019-02-10 DIAGNOSIS — R161 Splenomegaly, not elsewhere classified: Secondary | ICD-10-CM

## 2019-02-10 NOTE — Progress Notes (Signed)
Patient will call to schedule CT scan of chest and then follow up.  Order replaced and mailed to patients home address.  Stated an understanding.

## 2019-03-15 ENCOUNTER — Ambulatory Visit
Admission: RE | Admit: 2019-03-15 | Discharge: 2019-03-15 | Disposition: A | Discharge status: Home / Self Care | Payer: BC Managed Care – PPO | Source: Ambulatory Visit | Attending: Hematology & Oncology | Admitting: Hematology & Oncology

## 2019-03-15 DIAGNOSIS — C801 Malignant (primary) neoplasm, unspecified: Secondary | ICD-10-CM | POA: Insufficient documentation

## 2019-03-15 DIAGNOSIS — R911 Solitary pulmonary nodule: Secondary | ICD-10-CM | POA: Insufficient documentation

## 2019-03-15 DIAGNOSIS — R161 Splenomegaly, not elsewhere classified: Secondary | ICD-10-CM | POA: Insufficient documentation

## 2019-03-15 MED ORDER — IOHEXOL 350 MG/ML IV SOLN
100.00 mL | Freq: Once | INTRAVENOUS | Status: AC | PRN
Start: 2019-03-15 — End: 2019-03-15
  Administered 2019-03-15: 09:00:00 70 mL via INTRAVENOUS

## 2019-03-18 ENCOUNTER — Encounter (HOSPITAL_BASED_OUTPATIENT_CLINIC_OR_DEPARTMENT_OTHER): Payer: Self-pay | Admitting: Hematology & Oncology

## 2019-03-18 ENCOUNTER — Ambulatory Visit (HOSPITAL_BASED_OUTPATIENT_CLINIC_OR_DEPARTMENT_OTHER): Payer: BC Managed Care – PPO | Admitting: Hematology & Oncology

## 2019-03-18 VITALS — BP 92/62 | HR 76 | Temp 97.6°F | Resp 16 | Ht 64.0 in | Wt 154.8 lb

## 2019-03-18 DIAGNOSIS — R161 Splenomegaly, not elsewhere classified: Secondary | ICD-10-CM

## 2019-03-18 DIAGNOSIS — C801 Malignant (primary) neoplasm, unspecified: Secondary | ICD-10-CM

## 2019-03-18 NOTE — Progress Notes (Signed)
[facility] Office Visit Note      Patient Name: [facility]      Interval history:   Beth Dillon is a 43 y.o. female who was recently evaluated for her abdominal pain and in the process had an ultrasound which indicated enlarged spleen (14.8 cm). Abdominal Pain has resolved after starting on Protonix. Her weight has been stable. She denies fevers, drenching night sweats or any pain at this time. 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 Pt was seen earlier this year and is following up after a lapse of few months because of pandemic    Medications:     Outpatient Medications Marked as Taking for the 03/18/19 encounter (Office Visit) with Acquanetta Sit, MD   Medication Sig Dispense Refill    COLLAGEN PO Take by mouth      famotidine (PEPCID) 40 MG tablet Take 40 mg by mouth daily      loratadine (CLARITIN) 10 MG tablet Take 10 mg by mouth daily      Magnesium 400 MG Tab Take by mouth      Methylsulfonylmethane (MSM PO) Take by mouth      Multiple Vitamin (MULTIVITAMIN) capsule Take 1 capsule by mouth daily      Omega-3 Fatty Acids (OMEGA-3 FISH OIL) 500 MG Cap Take by mouth      pantoprazole (PROTONIX) 40 MG tablet Take 40 mg by mouth daily           Past History:   No Known Allergies    Past Medical History:   Diagnosis Date    Asthma         Review of Systems:   A comprehensive review of systems was:   General ROS: negative for - chills, fatigue, fever, malaise, night sweats or weight loss  Hematological and Lymphatic ROS: negative for - bleeding problems or blood clots  Respiratory ROS: no cough, shortness of breath, or wheezing  Cardiovascular ROS: no chest pain or dyspnea on exertion  Gastrointestinal ROS: no abdominal pain, change in bowel habits, or black or bloody stools  Genito-Urinary ROS: no dysuria, trouble voiding, or hematuria  Musculoskeletal ROS: negative for - gait disturbance or joint stiffness  Neurological ROS: negative for - confusion, dizziness or weakness       Physical Exam:     Vitals:    03/18/19 0905   BP: 92/62   Pulse: 76   Resp: 16   Temp: 97.6 F (36.4 C)   SpO2: 98%     General appearance - alert, well appearing, and in no distress  Mental status - alert, oriented to person, place, and time  Mouth - mucous membranes moist, pharynx normal without lesions  Neck - supple, no significant adenopathy  Lymphatics - no palpable lymphadenopathy, no hepatosplenomegaly  Chest - clear to auscultation, no wheezes, rales or rhonchi, symmetric air entry  Heart - normal rate, regular rhythm, normal S1, S2, no murmurs, rubs, clicks or gallops  Abdomen - soft, nontender, nondistended, no masses or organomegaly  Extremities - peripheral pulses normal, no pedal edema, no clubbing or cyanosis          Labs:     No visits with results within 1 Day(s) from this visit.   Latest known visit with results is:   Office Visit on 09/02/2018   Component Date Value Ref Range Status    WBC 09/02/2018 6.95  3.10 - 9.50 x10 3/uL Final    Hgb 09/02/2018 12.8  11.4 - 14.8 g/dL Final    Hematocrit 56/43/3295 38.3  34.7 - 43.7 % Final    Platelets 09/02/2018 218  142 - 346 x10 3/uL Final    RBC 09/02/2018 4.31  3.90 - 5.10 x10 6/uL Final    MCV 09/02/2018 88.9  78.0 - 96.0 fL Final    MCH 09/02/2018 29.7  25.1 - 33.5 pg Final    MCHC 09/02/2018 33.4  31.5 - 35.8 g/dL Final    RDW 18/84/1660 13  11 - 15 % Final    MPV 09/02/2018 10.8  8.9 - 12.5 fL Final    Neutrophils 09/02/2018 59.7  None % Final    Lymphocytes Automated 09/02/2018 27.2  None % Final    Monocytes 09/02/2018 6.8  None % Final    Eosinophils Automated 09/02/2018 5.8  None % Final    Basophils Automated 09/02/2018 0.4  None % Final    Immature Granulocytes 09/02/2018 0.1  None % Final    Nucleated RBC 09/02/2018 0.0  0.0 - 0.0 /100 WBC Final    Neutrophils Absolute 09/02/2018 4.15  1.10 - 6.33 x10 3/uL Final    Lymphocytes Absolute Automated 09/02/2018 1.89  0.42 - 3.22 x10 3/uL Final    Monocytes Absolute  Automated 09/02/2018 0.47  0.21 - 0.85 x10 3/uL Final    Eosinophils Absolute Automated 09/02/2018 0.40  0.00 - 0.44 x10 3/uL Final    Basophils Absolute Automated 09/02/2018 0.03  0.00 - 0.08 x10 3/uL Final    Immature Granulocytes Absolute 09/02/2018 0.01  0.00 - 0.07 x10 3/uL Final    Absolute NRBC 09/02/2018 0.00  0.00 - 0.00 x10 3/uL Final    Glucose 09/02/2018 88  70 - 100 mg/dL Final    Comment: ADA guidelines for diabetes mellitus:  Fasting:  Equal to or greater than 126 mg/dL  Random:   Equal to or greater than 200 mg/dL      BUN 63/08/6008 7.0  7.0 - 19.0 mg/dL Final    Creatinine 93/23/5573 0.7  0.4 - 1.5 mg/dL Final    Sodium 22/09/5425 137  136 - 145 mEq/L Final    Potassium 09/02/2018 3.8  3.5 - 5.1 mEq/L Final    Chloride 09/02/2018 104  100 - 111 mEq/L Final    CO2 09/02/2018 26  21 - 29 mEq/L Final    Calcium 09/02/2018 9.5  8.5 - 10.5 mg/dL Final    Protein, Total 09/02/2018 6.6  6.0 - 8.3 g/dL Final    Albumin 02/16/7627 3.8  3.5 - 5.0 g/dL Final    AST (SGOT) 31/51/7616 13  5 - 34 U/L Final    ALT 09/02/2018 10  0 - 55 U/L Final    Alkaline Phosphatase 09/02/2018 76  37 - 106 U/L Final    Bilirubin, Total 09/02/2018 0.8  0.2 - 1.2 mg/dL Final    Globulin 07/37/1062 2.8  2.0 - 3.7 g/dL Final    Albumin/Globulin Ratio 09/02/2018 1.4  0.9 - 2.2 Final    LDH 09/02/2018 129  125 - 331 U/L Final    Clinical Information: 09/02/2018 NOT PROVIDED   Final    Specimen Type 09/02/2018 SEE BELOW   Final    PERIPHERAL BLOOD    Viability 09/02/2018 97  % Final    Interpretation: 09/02/2018 SEE BELOW   Final    Comment: **NO ATYPICAL FLOW CYTOMETRIC FINDINGS SEEN**  Lymphocytes include polyclonal B cells, NK cells and  immunophenotypically normal CD4+ and CD8+ T cells in normal  proportions. No evidence of a clonal lymphoid expansion. Granulocytes  are immunophenotypically mature. Flow Cytometry reviewed by Cassell Clement, M.D.      Sample Description: 09/02/2018 SEE BELOW   Final     Comment: The analyzed WBCs in the sample include 22% lymphocytes, 4% monocytes  and 74% granulocytes.      Gating Strategy 09/02/2018 SEE BELOW   Final    Comment: Granulocytes and lymphocytes were selected for analysis based on CD45  staining intensity, forward scatter and side scatter.      Markers 09/02/2018 SEE BELOW   Final    Comment: Gate A - Granulocytes  Marker    Percentage  CD2          0  CD3          0  CD4          0  CD5          0  CD7          1  CD8          0  CD10         90  CD11c        98  CD13         95  CD19         0  CD19+CD5+    0  CD20         0  CD23         1  CD33         1  CD34         0  CD38         3  CD45         100  CD56+CD3-    0  CD64         1  CD117        0  HLA_DR       4  Kappa CD19+  0  Lambda CD19+ 0  K/L Ratio    NA  Gate B - Lymphocytes  Marker    Percentage  CD2          89  CD3          70  CD4          45  CD5          69  CD7          87  CD8          23  CD10         1  CD11c        12  CD13         0  CD19         3  CD19+CD5+    1  CD20         3  CD23         1  CD33         0  CD34         0  CD38         53  CD45         100  CD56+CD3-    26  CD64         0  CD117        0  HLA_DR       4  Kappa CD19+  2  Lambda CD19+ 1  K/L Ratio  2.00  This test was developed and its analytical performance  characteristics have been determined by [employer], [place], [place]. It has  not been cleared or approved by the U.S. Food and Drug  Administration. This assay has been validated pursuant  to the CLIA regulations and is used for clinical  purposes.      Number of Markers: 09/02/2018 22   Final    Comment: Test Performed by Clerance Lav,  [facility],  [address], [place], [place] [address]  Si Raider, M.D., Ph.D., Director of Laboratories  [phone], CLIA [id number]      Markers 09/02/2018 -   Final    Immunoglobulin G 09/02/2018 1,135  540 - 1,822 mg/dL Final     Immunoglobulin A 09/02/2018 194  65 - 421 mg/dL Final    Immunoglobulin M 09/02/2018 59  22 - 293 mg/dL Final    IFE Serum Interp 09/02/2018 See IFE Review   Final    Protein, Total 09/02/2018 6.6  6.0 - 8.3 g/dL Final    Albumin % 13/03/6577 50.9  46.6 - 62.6 % Final    Albumin, Synovial 09/02/2018 3.4  3.4 - 4.8 g/dL Final    Alpha-1 Glob % 09/02/2018 2.8  1.7 - 4.1 % Final    Alpha-1 Globulin 09/02/2018 0.2  0.1 - 0.4 g/dL Final    Alpha-2 Glob % 09/02/2018 13.0  8.9 - 14.9 % Final    Alpha-2 Globulin 09/02/2018 0.9  0.8 - 1.2 g/dL Final    Beta Glob % 46/96/2952 15.2  10.9 - 18.9 % Final    Beta Globulin 09/02/2018 1.0  0.6 - 1.2 g/dL Final    Gamma Globulin % 09/02/2018 18.0  9.8 - 24.4 % Final    Gamma Globulin 09/02/2018 1.2  0.6 - 1.7 g/dL Final    Free Kappa, Serum 09/02/2018 13.2  3.3 - 19.4 mg/L Final    Free Lambda, Serum 09/02/2018 18.0  5.7 - 26.3 mg/L Final    Free Kappa/Lambda Ratio 09/02/2018 0.73  0.26 - 1.65 Final    Comment: Free kappa/lambda ratio in serum of normal individuals  is 0.26-1.65. Excess production of free kappa or  lambda chains can alter the ratio. Monoclonal free  light chains are found in the serum of patients with  multiple myeloma, Waldenstrom's macroglobulinemia,  mu-heavy chain disease, primary amyloidosis, light  chain deposition disease, monoclonal gammopathy of  undetermined significance, and lymphoproliferative  disorders. Measurement of free light chain concen-  tration in serum is useful for diagnosis, prognosis,  monitoring disease activity and following response to  therapy of these disorders.  Test Performed by Clerance Lav,  [facility],  [address] [place], [place] [address]  Si Raider, M.D., Ph.D., Director of Laboratories  [phone], CLIA [id number]      Beta-2-Microglobulin 09/02/2018 1.56  1.21 - 2.70 Final    Comment: Test Performed by:  [facility]  [address] [place], [place], [place] [address]  Lab Director: Paul Dykes M.D. Ph.D.; CLIA# [id number]      Hemolysis Index 09/02/2018 5  0 - 18 Final    EGFR 09/02/2018 >60.0   Final    Comment: Disease State Reference Ranges:    Chronic Kidney Disease; < 60 ml/min/1.73 sq.m  Kidney Failure; < 15 ml/min/1.73 sq.m    [Calculated using IDMS-Traceable MDRD equation (based on    gender, age and black vs. non-black race) recommended by    [employer] Disease Education Program. No data    available for non-white, non-black race.]  GFR estimates are unreliable in patients with:    Rapidly changing kidney function or recent dialysis,    extreme age, body size or body composition(obesity,    severe malnutrition). Abnormal muscle mass (limb    amputation, muscle wasting). In these patients,    alternative determinations of GFR should be obtained.      Serum Prot Electrophoresis Revwew 09/02/2018 See Note   Final    Comment: Electrophoretic Pattern:  Normal electrophoretic pattern.  Electronically reviewed and signed: Myong Ho(Lucy) Nam, M.D.      Griffith Citron Serum Review 09/02/2018 See Note   Final    Comment: Electrophoretic Pattern:  Serum Immunofixation is negative for Monoclonal Protein.  Electronically reviewed and signed: Myong Ho(Lucy) Nam, M.D.           Rads:   Ct Chest W Contrast    Result Date: 03/15/2019   2 mm right lower lobe nodule which needs no additional follow-up in the absence of known malignancy or significant risk factors. Bosie Helper, MD  03/15/2019 9:30 AM      Assessment:   Very pleasant 43 year old lady with borderline Splenomegaly on ultrasound abdomen  CBC normal range    Plan:   Ct scan abdomen pelvis with no evidence of splenomegaly or Lymphadenopathy  Flow cytometry with no atypical findings  Serum Protein electrophoresis with no evidence of monoclonal protein  Ct scan chest indicated 2mm lower lobe nodule  Will order repeat ct scan chest in 6 months to monitor this non specific nodule. Family  history of lung cancer  Will not need any further workup for Splenomegaly

## 2019-03-20 ENCOUNTER — Ambulatory Visit (FREE_STANDING_LABORATORY_FACILITY): Payer: BC Managed Care – PPO

## 2019-03-20 DIAGNOSIS — Z01818 Encounter for other preprocedural examination: Secondary | ICD-10-CM

## 2019-03-21 LAB — COVID-19 (SARS-COV-2): SARS CoV 2 Overall Result: NOT DETECTED

## 2019-03-22 ENCOUNTER — Telehealth: Payer: Self-pay

## 2019-03-22 ENCOUNTER — Ambulatory Visit: Payer: BC Managed Care – PPO

## 2019-03-22 NOTE — Telephone Encounter (Signed)
COVID-19 Test Results Notification Team    Patient was sent to [facility] by Dr. Chand for COVID testing.  Spoke with Surgical Coordinator Dylanie Quesenberry at the office and provided NEGATIVE COVID-19 test result.      After verifying patient name, address and DOB, result was provided.    Routed COVID test result to Dr. Chand via Epic/[phone].     Darla Mcdonald BSN, CCM  COVID-19 Notification Team  Direct Line: [phone]  COVID-19 Test Results Call Center: [phone]

## 2019-08-18 ENCOUNTER — Other Ambulatory Visit: Payer: Self-pay | Admitting: Obstetrics and Gynecology

## 2019-08-18 DIAGNOSIS — N6489 Other specified disorders of breast: Secondary | ICD-10-CM

## 2019-09-06 ENCOUNTER — Ambulatory Visit
Admission: RE | Admit: 2019-09-06 | Discharge: 2019-09-06 | Disposition: A | Discharge status: Home / Self Care | Payer: BC Managed Care – PPO | Source: Ambulatory Visit | Attending: Obstetrics and Gynecology | Admitting: Obstetrics and Gynecology

## 2019-09-06 ENCOUNTER — Other Ambulatory Visit: Payer: Self-pay

## 2019-09-06 DIAGNOSIS — N6489 Other specified disorders of breast: Secondary | ICD-10-CM

## 2019-09-20 ENCOUNTER — Telehealth: Payer: Self-pay | Admitting: Hematology & Oncology

## 2019-09-20 NOTE — Telephone Encounter (Signed)
Spoke with patient regarding upcoming CT exam appointment. Per MD Patient will not need CT in January will actually change to July 2021 and then follow up with MD after to review imaging. Patient will call office back once have CT rescheduled to reschedule office appointment.   No additional questions or concerns at this time.

## 2019-09-24 ENCOUNTER — Ambulatory Visit: Payer: BC Managed Care – PPO

## 2019-10-06 ENCOUNTER — Ambulatory Visit: Payer: BC Managed Care – PPO | Admitting: Hematology & Oncology

## 2020-02-25 ENCOUNTER — Ambulatory Visit: Payer: BC Managed Care – PPO

## 2020-02-29 ENCOUNTER — Ambulatory Visit: Payer: BC Managed Care – PPO | Admitting: Hematology & Oncology

## 2020-03-08 ENCOUNTER — Telehealth: Payer: Self-pay | Admitting: Hematology & Oncology

## 2020-03-08 DIAGNOSIS — R161 Splenomegaly, not elsewhere classified: Secondary | ICD-10-CM

## 2020-03-08 NOTE — Telephone Encounter (Signed)
Orders have been placed again and will now expire next July 2022

## 2020-03-08 NOTE — Telephone Encounter (Signed)
Patient requesting for a new CT and blood work order to be added in system as they both expire on July 23rd. She would like to be contacted once the orders have been added in the system.     Cb# [id number], thank you

## 2020-03-17 ENCOUNTER — Ambulatory Visit: Payer: BC Managed Care – PPO

## 2020-03-21 ENCOUNTER — Ambulatory Visit: Payer: BC Managed Care – PPO | Admitting: Hematology & Oncology

## 2020-03-27 ENCOUNTER — Telehealth: Payer: Self-pay | Admitting: Hematology & Oncology

## 2020-03-27 ENCOUNTER — Other Ambulatory Visit (FREE_STANDING_LABORATORY_FACILITY): Payer: BC Managed Care – PPO

## 2020-03-27 DIAGNOSIS — R161 Splenomegaly, not elsewhere classified: Secondary | ICD-10-CM

## 2020-03-27 LAB — COMPREHENSIVE METABOLIC PANEL
ALT: 13 U/L (ref 0–55)
AST (SGOT): 14 U/L (ref 5–34)
Albumin/Globulin Ratio: 1.2 (ref 0.9–2.2)
Albumin: 3.8 g/dL (ref 3.5–5.0)
Alkaline Phosphatase: 84 U/L (ref 37–106)
Anion Gap: 11 (ref 5.0–15.0)
BUN: 9 mg/dL (ref 7.0–19.0)
Bilirubin, Total: 0.8 mg/dL (ref 0.2–1.2)
CO2: 23 mEq/L (ref 21–29)
Calcium: 9.4 mg/dL (ref 8.5–10.5)
Chloride: 105 mEq/L (ref 100–111)
Creatinine: 0.8 mg/dL (ref 0.4–1.5)
Globulin: 3.1 g/dL (ref 2.0–3.7)
Glucose: 92 mg/dL (ref 70–100)
Potassium: 4.2 mEq/L (ref 3.5–5.1)
Protein, Total: 6.9 g/dL (ref 6.0–8.3)
Sodium: 139 mEq/L (ref 136–145)

## 2020-03-27 LAB — HEMOLYSIS INDEX: Hemolysis Index: 12 (ref 0–18)

## 2020-03-27 LAB — GFR: EGFR: >60

## 2020-03-27 NOTE — Telephone Encounter (Signed)
Patient called, her insurance is requesting that the diagnosis code be changed to C80.1 (listed as the Primary code) instead of Splenomegaly as insurance will not approve her scan.     Please change the diagnosis code on file and re-enter order in system, thank you.

## 2020-03-28 ENCOUNTER — Telehealth: Payer: Self-pay | Admitting: Hematology & Oncology

## 2020-03-28 NOTE — Telephone Encounter (Signed)
Pt called to check the status if order has been changed. Patients appt for CT scans will be Thursday morning 8/5 and would like to pick up order prior to appt.     Pt would like to be contacted once Diagnosis code has been updated. Please call patient or let front desk know to contact pt once ready, thank you!  [phone]

## 2020-03-28 NOTE — Telephone Encounter (Signed)
Returned phone call to patient regarding diagnosis code. Explained to patient why diagnosis code does not include C80.1    This RN discussed with provider prior to calling patient and provider and this RN reviewed chart.   Per provider from last imaging done July 2020 patient is OK to not move forward with CT scan. Patient would like to keep CT appointments as father did have hx of lung cancer.     Patient verbalized understanding and will continue with appointment for imaging on Thursday 8/5. Patient has follow up appointment to discuss imaging results with provider on Monday 04/03/20      No additional questions or concerns at this time.

## 2020-03-30 ENCOUNTER — Ambulatory Visit: Payer: BC Managed Care – PPO

## 2020-03-30 ENCOUNTER — Other Ambulatory Visit: Payer: Self-pay

## 2020-03-30 ENCOUNTER — Telehealth: Payer: Self-pay

## 2020-03-30 DIAGNOSIS — R161 Splenomegaly, not elsewhere classified: Secondary | ICD-10-CM

## 2020-03-30 NOTE — Telephone Encounter (Signed)
Patient called this morning for order for CT of chest w/ contrast. Insurance is not approving CT for follow up. Past orders have included diagnosis code for Malignant Neoplasm (C81.0), however, patient is no longer being worked up for this diagnosis. Entered second CT order for CT of chest with contrast with diagnosis code for Splenomegaly. Second order should be order used. Initial order to be canceled.

## 2020-04-03 ENCOUNTER — Ambulatory Visit: Payer: BC Managed Care – PPO | Admitting: Hematology & Oncology

## 2020-04-13 ENCOUNTER — Ambulatory Visit: Payer: BC Managed Care – PPO | Attending: Hematology & Oncology

## 2020-04-13 DIAGNOSIS — R911 Solitary pulmonary nodule: Secondary | ICD-10-CM | POA: Insufficient documentation

## 2020-04-13 DIAGNOSIS — R161 Splenomegaly, not elsewhere classified: Secondary | ICD-10-CM | POA: Insufficient documentation

## 2020-04-13 MED ORDER — IOHEXOL 350 MG/ML IV SOLN
100.0000 mL | Freq: Once | INTRAVENOUS | Status: AC | PRN
Start: 2020-04-13 — End: 2020-04-13
  Administered 2020-04-13: 100 mL via INTRAVENOUS

## 2020-05-03 ENCOUNTER — Telehealth: Payer: Self-pay

## 2020-05-03 ENCOUNTER — Ambulatory Visit: Payer: BC Managed Care – PPO | Attending: Hematology & Oncology | Admitting: Hematology & Oncology

## 2020-05-03 ENCOUNTER — Encounter: Payer: Self-pay | Admitting: Hematology & Oncology

## 2020-05-03 DIAGNOSIS — R911 Solitary pulmonary nodule: Secondary | ICD-10-CM

## 2020-05-03 NOTE — Telephone Encounter (Signed)
Spoke with patient and she is requested to have her CT scans and most recent labs to be fax to Dr. Skip Estimable with [facility].   Fax number is [phone]. Per patient request medical records fax.

## 2020-05-03 NOTE — Progress Notes (Signed)
VIDEO VISIT - [facility] FOLLOW UP     CONSENT: Verbal consent has been obtained from the patient to conduct a video visit encounter to minimize exposure to COVID-19: yes    Total time spent face to face with patient:  minutes.  Time spent on counseling/coordination of care: > 50%.  Regarding: Splenomegaly, Solitary lung nodule      HPI/ONCOLOGY HISTORY: Ms. Beth Dillon was seen today via video visit in oncology follow up.  She is a 44 y.o. female with oncology history as follows:  44 year old lady with borderline Splenomegaly on ultrasound abdomen.   Ct scan abdomen pelvis no evidence of splenomegaly or Lymphadenopathy  Ct scan chest 2 mm lower lobe nodule  INTERVAL HISTORY:   Ms. Beth Dillon is well with no complaints of cough, shortness of breath or pains    MEDICATIONS/ALLERGIES:     No outpatient medications have been marked as taking for the 05/03/20 encounter (Appointment) with Acquanetta Sit, MD.       No Known Allergies    PAST HISTORY:    Past Medical History:   Diagnosis Date    Asthma         Past Surgical History:   Procedure Laterality Date    WISDOM TOOTH EXTRACTION  2006       Family History   Problem Relation Age of Onset    Arthritis Mother     Heart attack Mother     Cancer Father     Autoimmune disease Sister     Ovarian cancer Paternal Grandmother     Anxiety disorder Child     Breast cancer Neg Hx     Colon cancer Neg Hx     Rectal cancer Neg Hx     Lung cancer Neg Hx        Social History     Socioeconomic History    Marital status: Married     Spouse name: Not on file    Number of children: Not on file    Years of education: Not on file    Highest education level: Not on file   Occupational History    Not on file   Tobacco Use    Smoking status: Former Smoker     Packs/day: 1.00     Years: 15.00     Pack years: 15.00     Types: Cigarettes     Quit date: 09/03/2011     Years since quitting: 8.6    Smokeless tobacco: Never Used   Substance and Sexual Activity     Alcohol use: Yes    Drug use: Not on file    Sexual activity: Not on file   Other Topics Concern    Exposure to radiation? Not Asked    Exposure to other chemicals? Not Asked    Exposure to Asbestos? Not Asked   Social History Narrative    Not on file     Social Determinants of Health     Financial Resource Strain:     Difficulty of Paying Living Expenses:    Food Insecurity:     Worried About Programme researcher, broadcasting/film/video in the Last Year:     Barista in the Last Year:    Transportation Needs:     Freight forwarder (Medical):     Lack of Transportation (Non-Medical):    Physical Activity:     Days of Exercise per Week:     Minutes of  Exercise per Session:    Stress:     Feeling of Stress :    Social Connections:     Frequency of Communication with Friends and Family:     Frequency of Social Gatherings with Friends and Family:     Attends Religious Services:     Active Member of Clubs or Organizations:     Attends Engineer, structural:     Marital Status:    Intimate Partner Violence:     Fear of Current or Ex-Partner:     Emotionally Abused:     Physically Abused:     Sexually Abused:        Patient Active Problem List   Diagnosis    Splenomegaly       REVIEW OF SYSTEMS: All other systems reviewed and negative except as above.     PHYSICAL EXAMINATION  Vital Signs: LMP 09/18/2017      Home BP:   Home Temp:   Home Scale Weight:   Home Pulse:   Visualized RR:     The following limited physical exam components were observed via video:    General Appearance:  Alert, cooperative, no distress, appears stated age   Head: Normocephalic, without obvious abnormality, atraumatic   Eyes:  Sclera anicteric, conjunctiva without pallor   Lungs:    Abdomen:   Breathing comfortably without accessory muscle use. Normal respiratory effort.   Non-distended   Musculoskeletal: No apparent gait or station abnormalities    Skin: Normal skin color, texture, no visible rashes or lesions   Extremities: No  apparent edema   Neurologic:  Mental Status: Alert and oriented x3, face symmetric, tongue midline.  Normal mood and affect, cooperative, judgement intact     LABS/RADIOLOGY:     No visits with results within 1 Day(s) from this visit.   Latest known visit with results is:   Appointment on 03/27/2020   Component Date Value Ref Range Status    Glucose 03/27/2020 92  70 - 100 mg/dL Final    BUN 16/05/9603 9.0  7 - 19 mg/dL Final    Creatinine 54/04/8118 0.8  0.4 - 1.5 mg/dL Final    Sodium 14/78/2956 139  136 - 145 mEq/L Final    Potassium 03/27/2020 4.2  3.5 - 5.1 mEq/L Final    Chloride 03/27/2020 105  100 - 111 mEq/L Final    CO2 03/27/2020 23  21 - 29 mEq/L Final    Calcium 03/27/2020 9.4  8.5 - 10.5 mg/dL Final    Protein, Total 03/27/2020 6.9  6.0 - 8.3 g/dL Final    Albumin 21/30/8657 3.8  3.5 - 5.0 g/dL Final    AST (SGOT) 84/69/6295 14  5 - 34 U/L Final    ALT 03/27/2020 13  0 - 55 U/L Final    Alkaline Phosphatase 03/27/2020 84  37 - 106 U/L Final    Bilirubin, Total 03/27/2020 0.8  0.2 - 1.2 mg/dL Final    Globulin 28/41/3244 3.1  2.0 - 3.7 g/dL Final    Albumin/Globulin Ratio 03/27/2020 1.2  0.9 - 2.2 Final    Anion Gap 03/27/2020 11.0  5 - 15 Final    Hemolysis Index 03/27/2020 12  0 - 18 Final    EGFR 03/27/2020 >60.0   Final       IMPRESSION/RECOMMENDATIONS: Ms. [name] is a 44 y.o. female with No diagnosis found.  44 year old lady with borderline Splenomegaly on ultrasound abdomen.   Ct scan abdomen pelvis no  evidence of splenomegaly or Lymphadenopathy  Leukemia/Lymphoma panel with no atypical findings  Ct scan chest 2 mm lower lobe nodule 7/20  Serum Protein electrophoresis with no evidence of monoclonal protein  Repeat ct scan of chest with stable Rt lower lobe nodule 8/21  Pt will not need any further follow ups or repeat imaging studies

## 2021-06-16 IMAGING — MG MM DIGITAL DIAGNOSTIC UNILAT*R* W/ TOMO W/ CAD
4 series · 4 of 12 positions shown · non-contrast
Comparison: Previous exam(s).

CLINICAL DATA: Screening recall for a right breast asymmetry.

EXAM:
DIGITAL DIAGNOSTIC RIGHT MAMMOGRAM WITH CAD AND TOMO
ULTRASOUND RIGHT BREAST

[R CC synth-2D]
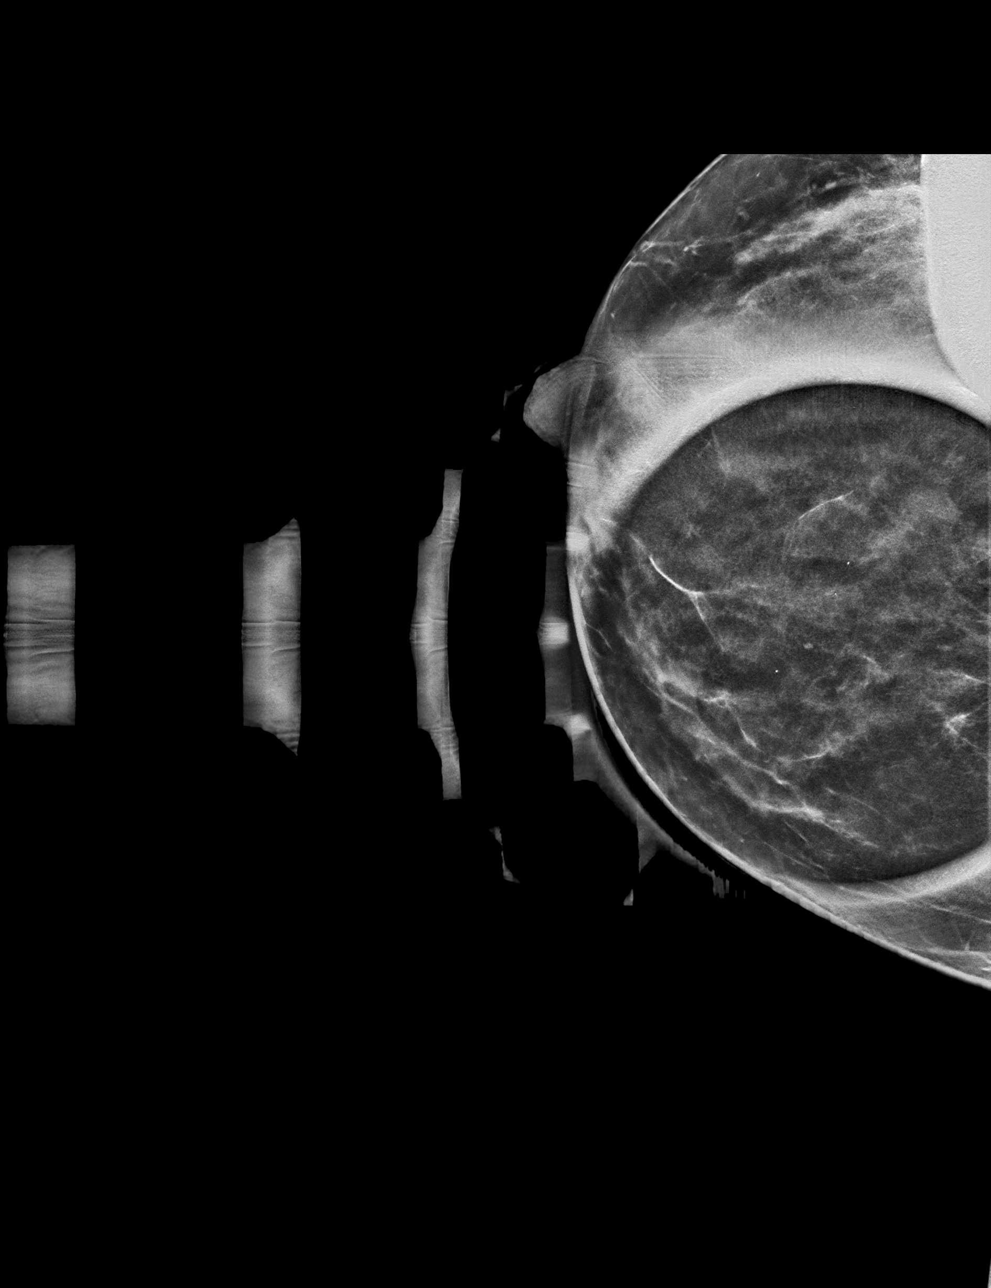

[R ML synth-2D]
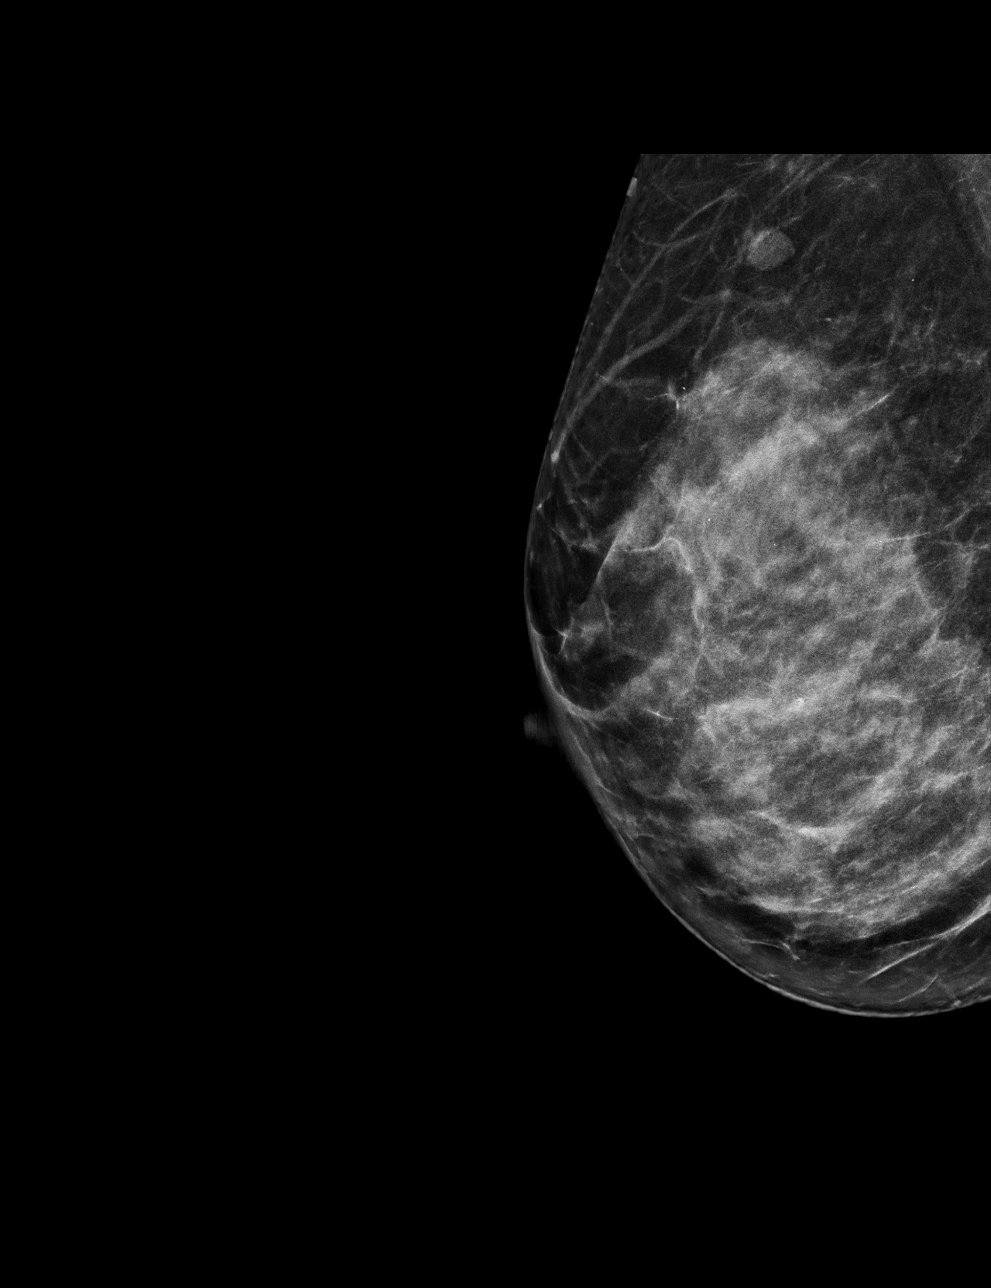

[R CC tomo · tomo slice 27/52.0]
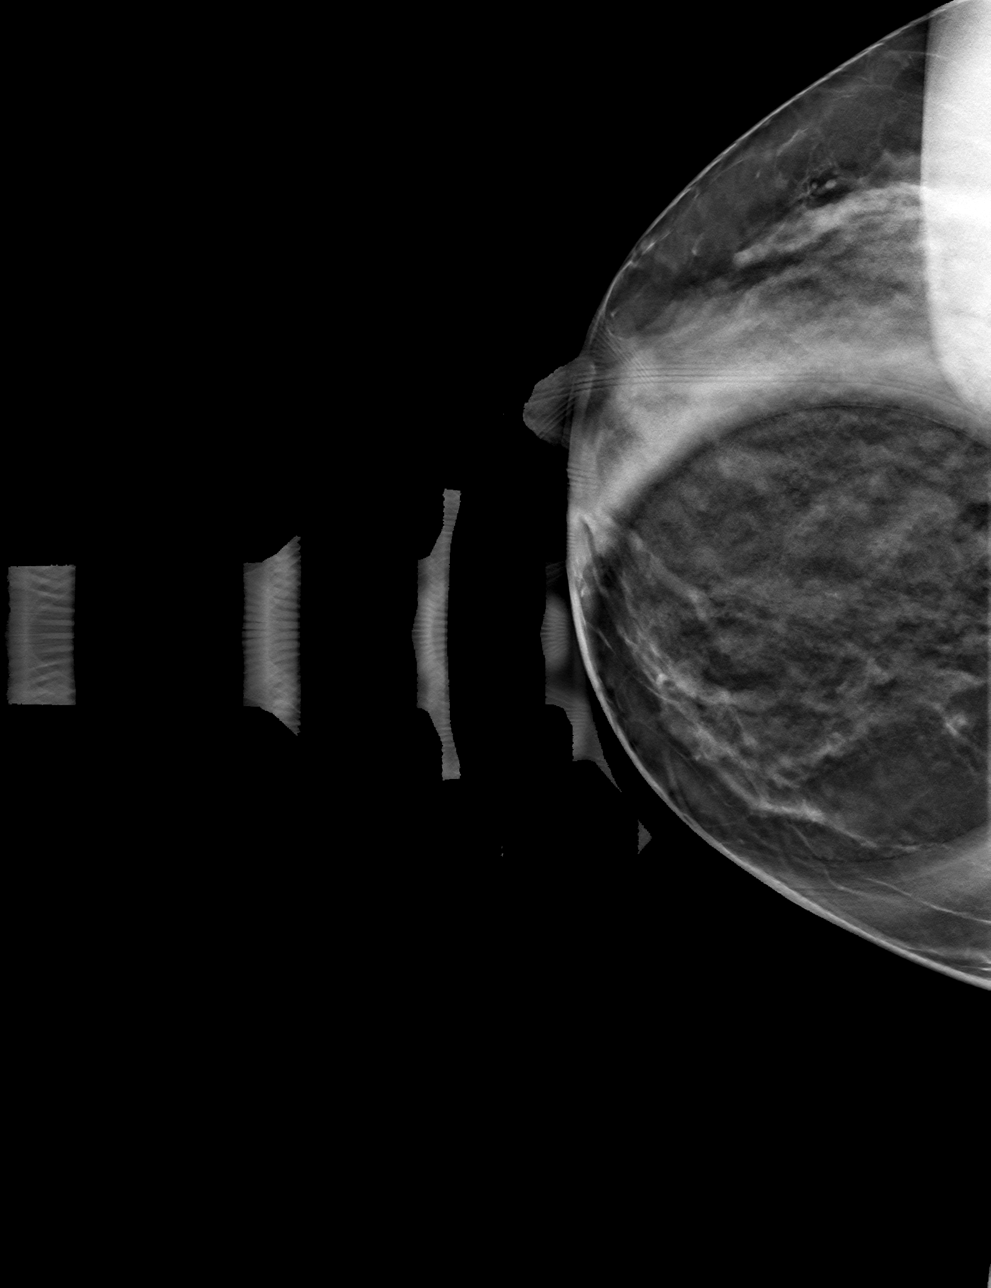

[R ML tomo · tomo slice 33/66.0]
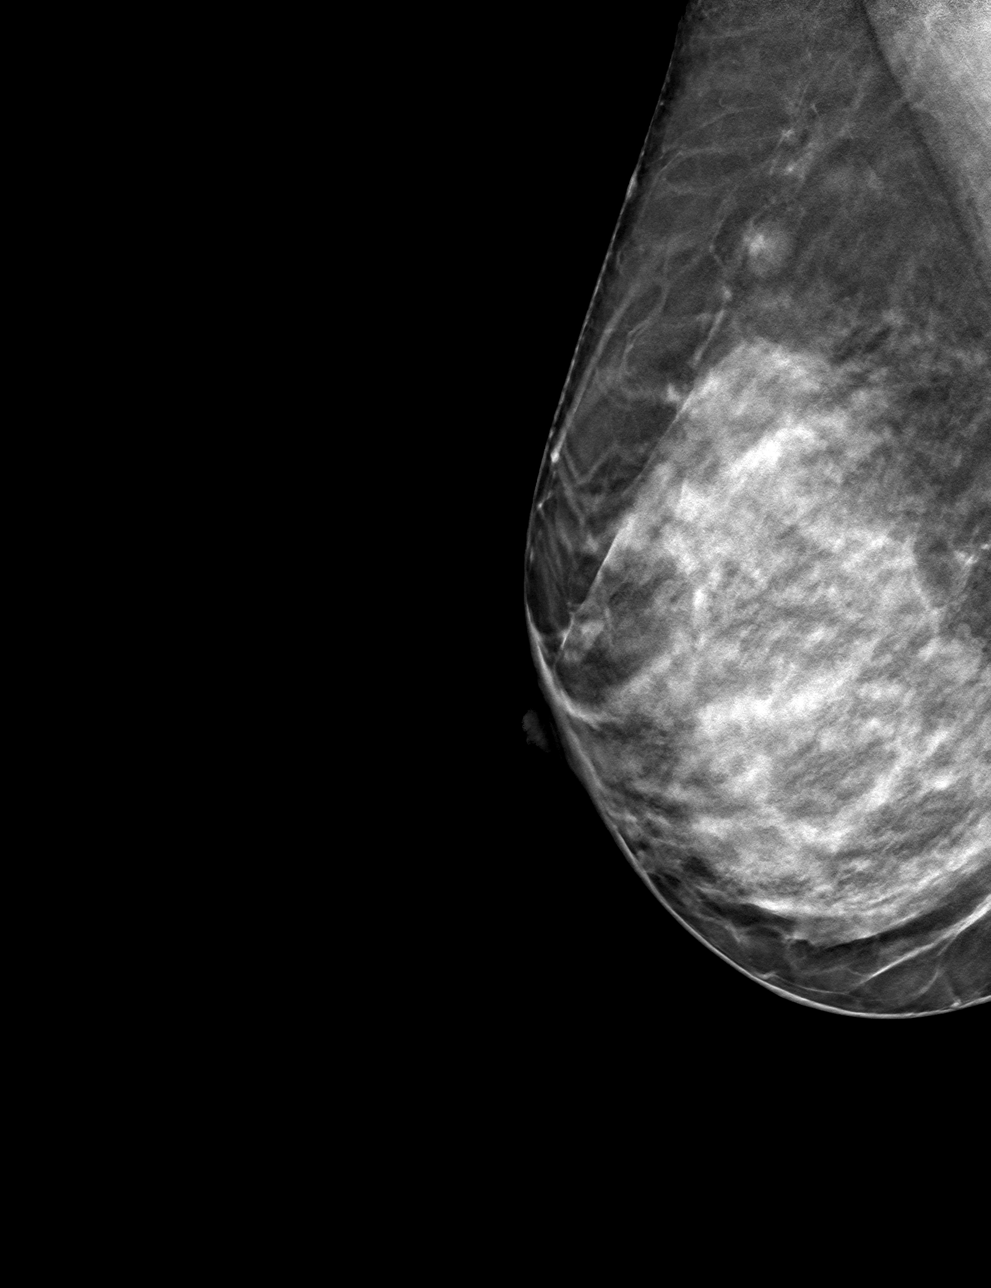

[4 of 12 positions shown; findings below may reference images not displayed]

ACR Breast Density Category c: The breast tissue is heterogeneously
dense, which may obscure small masses.
FINDINGS: No definite persistent asymmetries are seen on the spot compression
tomosynthesis images of the medial right breast. No suspicious
calcifications, masses or areas of distortion are seen in the right
breast.

Mammographic images were processed with CAD.

Ultrasound of the lower-inner quadrant of the right breast
demonstrates a few tiny scattered benign-appearing cysts. A
representative image was acquired at [DATE], 2 cm from the nipple with
the cyst measuring 5 x 3 x 4 mm. No suspicious masses or areas of
shadowing are identified.
IMPRESSION: No persistent suspicious abnormalities are identified in the
lower-inner quadrant of the right breast. The asymmetry of concern
on the patient's mammogram may correspond with fibrocystic change
which was seen sonographically.

RECOMMENDATION:
Screening mammogram in one year.(Code:[id number])

I have discussed the findings and recommendations with the patient.
If applicable, a reminder letter will be sent to the patient
regarding the next appointment.

BI-RADS CATEGORY  2: Benign.

## 2021-06-16 IMAGING — US US BREAST*R* LIMITED INC AXILLA
1 series · 7 of 7 positions shown · non-contrast
Comparison: Previous exam(s).

CLINICAL DATA: Screening recall for a right breast asymmetry.

EXAM:
DIGITAL DIAGNOSTIC RIGHT MAMMOGRAM WITH CAD AND TOMO
ULTRASOUND RIGHT BREAST

[Series 1: us breast*right* limited inc axilla · 0.06mm/px · 7 of 7 slices shown]
[im 1/7]
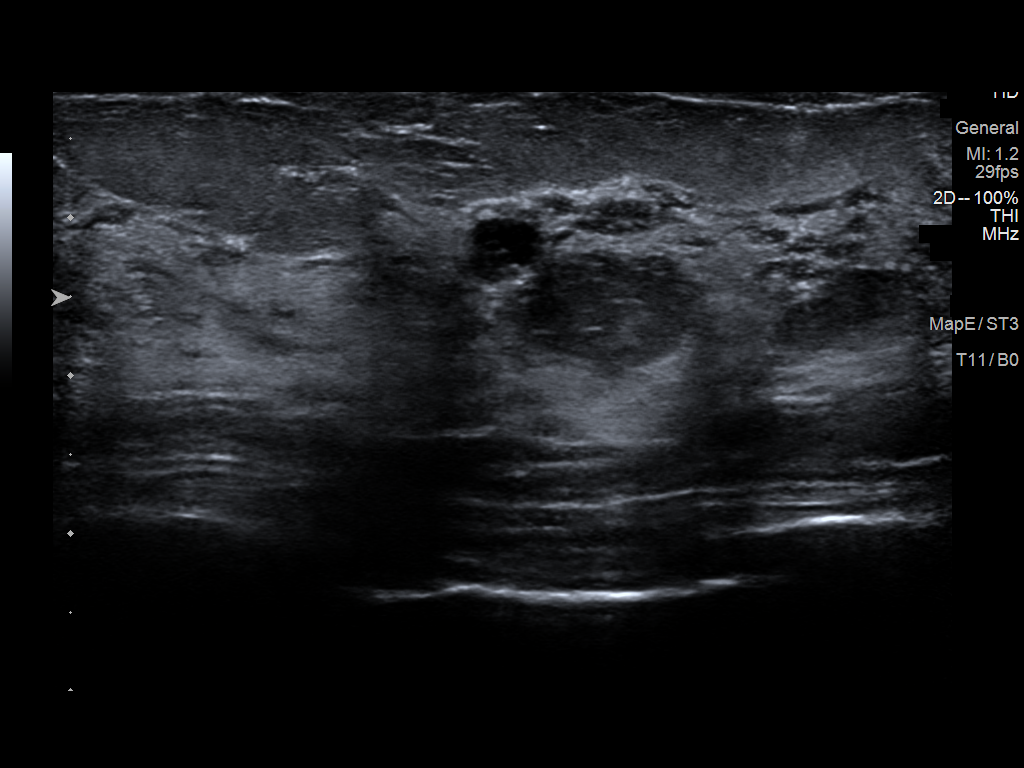
[im 2/7]
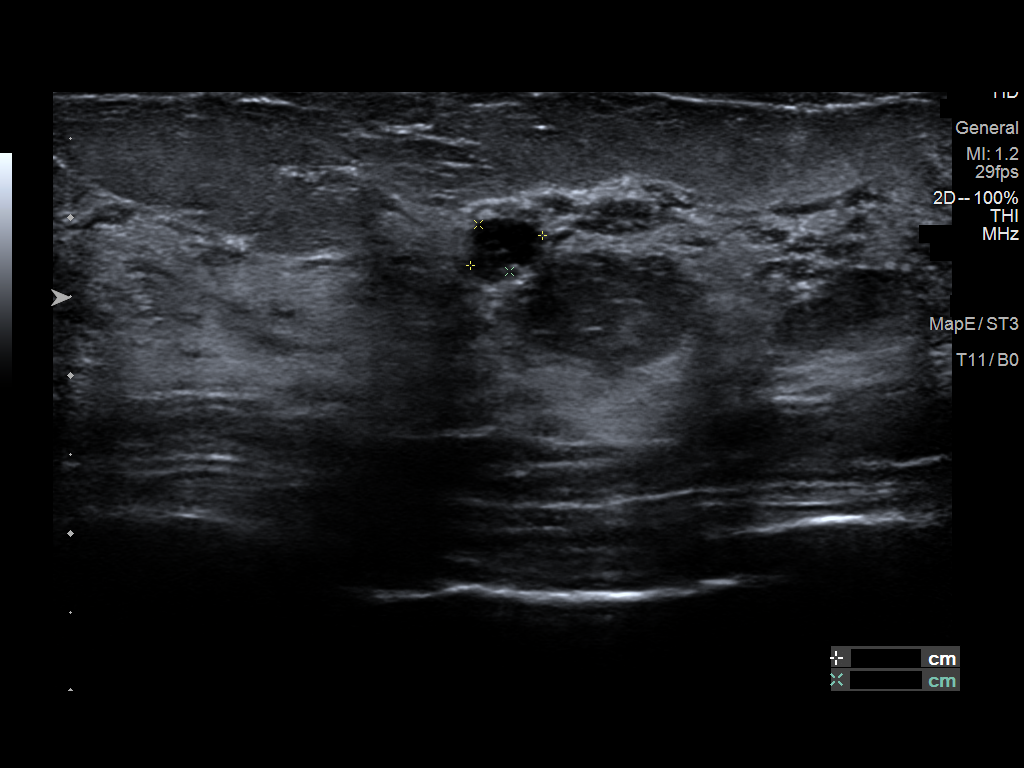
[im 3/7]
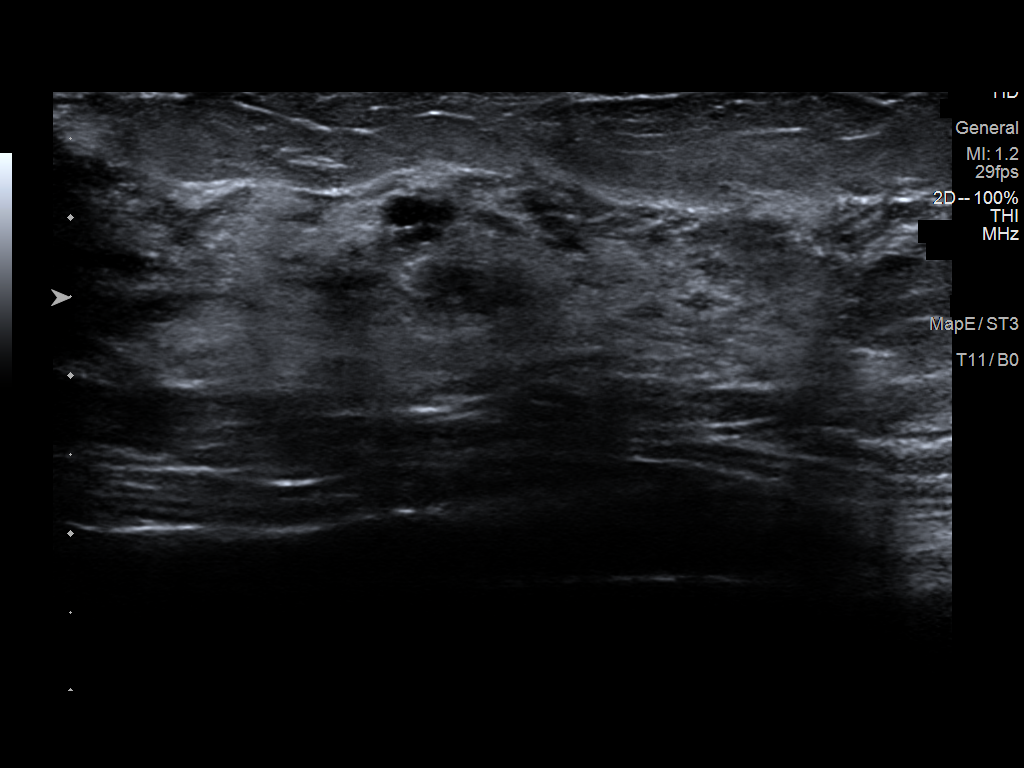
[im 4/7]
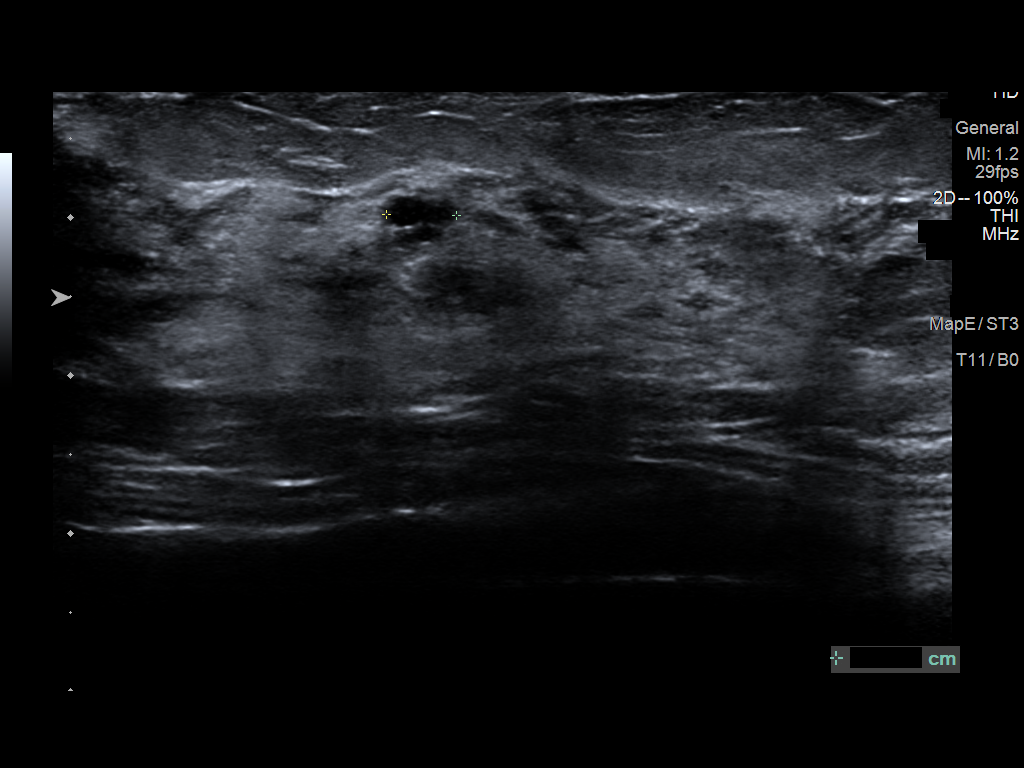
[im 5/7]
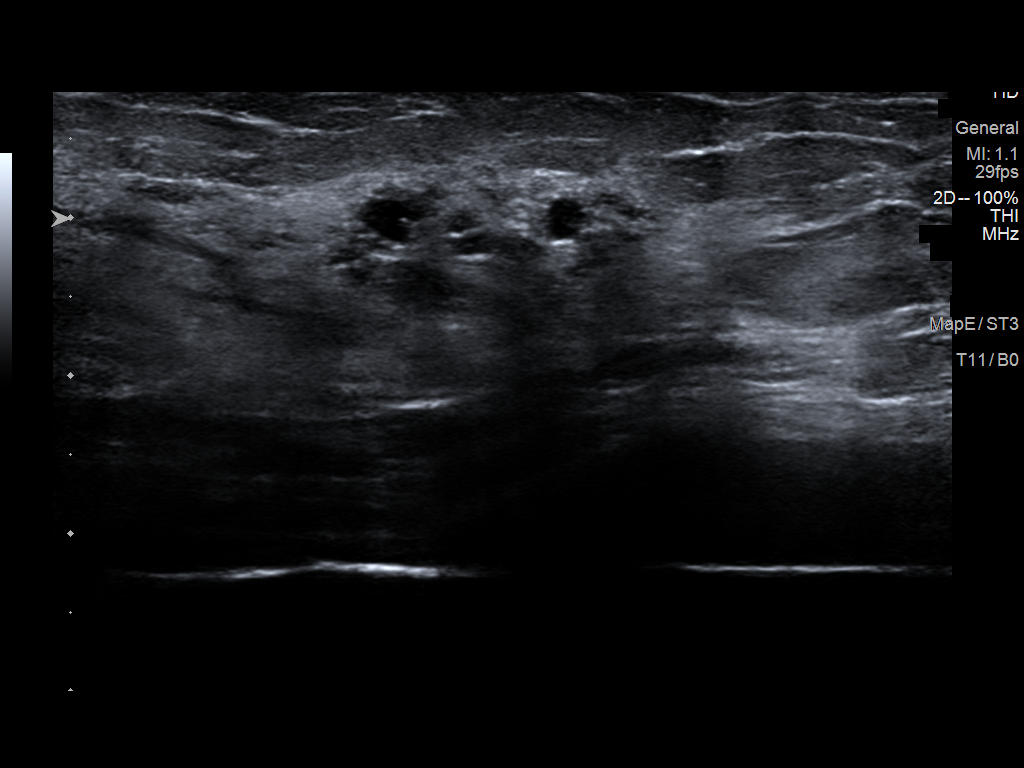
[im 6/7]
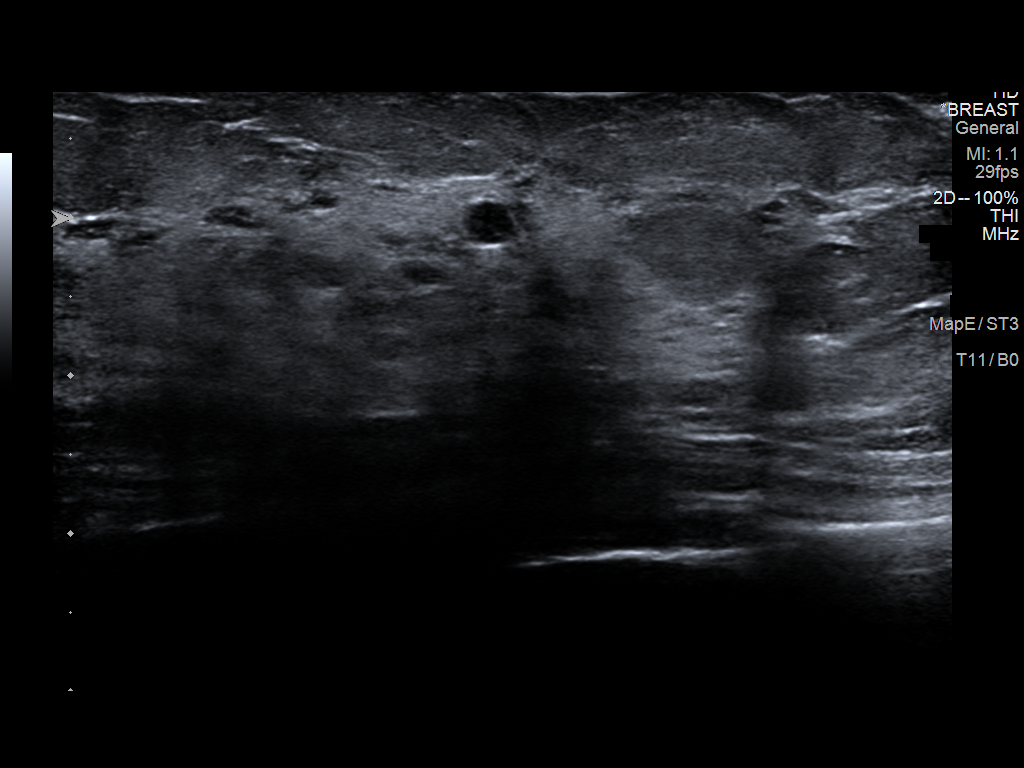
[im 7/7]
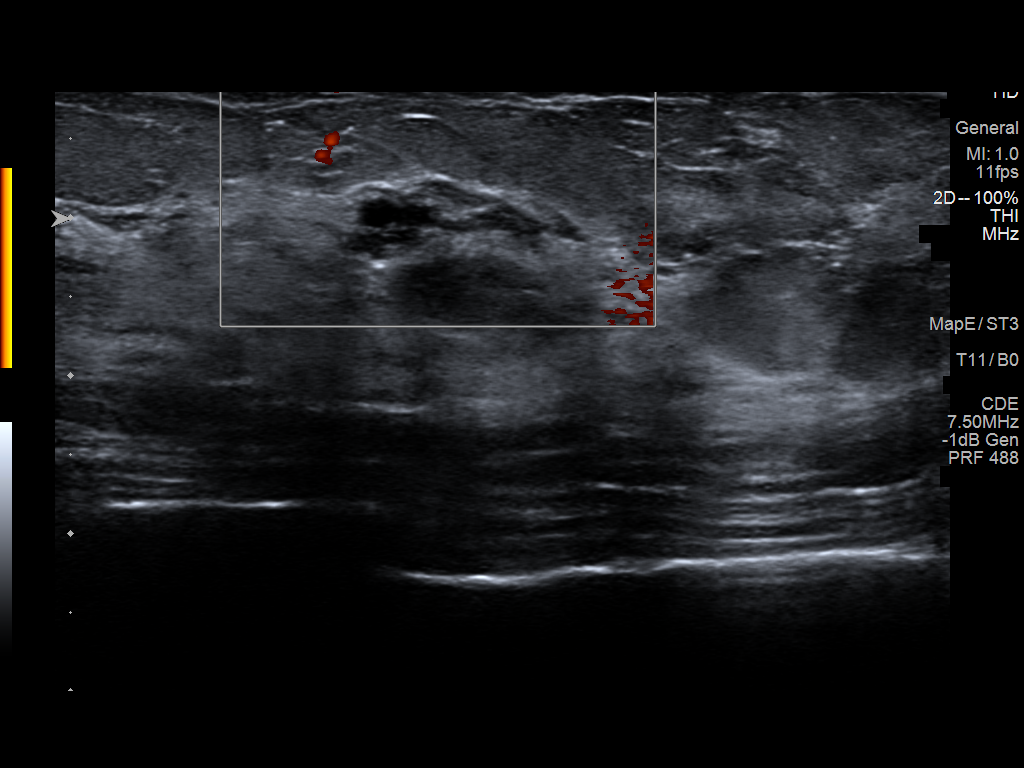

[7 of 7 positions shown; findings below may reference images not displayed]

ACR Breast Density Category c: The breast tissue is heterogeneously
dense, which may obscure small masses.
FINDINGS: No definite persistent asymmetries are seen on the spot compression
tomosynthesis images of the medial right breast. No suspicious
calcifications, masses or areas of distortion are seen in the right
breast.

Mammographic images were processed with CAD.

Ultrasound of the lower-inner quadrant of the right breast
demonstrates a few tiny scattered benign-appearing cysts. A
representative image was acquired at [DATE], 2 cm from the nipple with
the cyst measuring 5 x 3 x 4 mm. No suspicious masses or areas of
shadowing are identified.
IMPRESSION: No persistent suspicious abnormalities are identified in the
lower-inner quadrant of the right breast. The asymmetry of concern
on the patient's mammogram may correspond with fibrocystic change
which was seen sonographically.

RECOMMENDATION:
Screening mammogram in one year.(Code:[id number])

I have discussed the findings and recommendations with the patient.
If applicable, a reminder letter will be sent to the patient
regarding the next appointment.

BI-RADS CATEGORY  2: Benign.
